# Patient Record
Sex: Male | Born: 1980 | Race: White | Hispanic: No | Marital: Single | Smoking: Current every day smoker
Health system: Southern US, Community
[De-identification: ages and names within clinical notes are randomized; demographics above are authoritative.]

## PROBLEM LIST (undated history)

## (undated) VITALS — BP 111/76 | HR 88 | Temp 97.4°F | Resp 16 | Ht 68.0 in | Wt 176.0 lb

## (undated) VITALS — BP 113/76 | HR 93 | Temp 97.3°F | Resp 20 | Ht 67.5 in | Wt 188.0 lb

## (undated) DIAGNOSIS — F419 Anxiety disorder, unspecified: Secondary | ICD-10-CM

## (undated) DIAGNOSIS — F329 Major depressive disorder, single episode, unspecified: Secondary | ICD-10-CM

## (undated) DIAGNOSIS — F99 Mental disorder, not otherwise specified: Secondary | ICD-10-CM

## (undated) DIAGNOSIS — F32A Depression, unspecified: Secondary | ICD-10-CM

---
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
## 2001-03-31 ENCOUNTER — Encounter: Payer: Self-pay | Admitting: Emergency Medicine

## 2001-03-31 ENCOUNTER — Emergency Department (HOSPITAL_COMMUNITY): Admission: EM | Admit: 2001-03-31 | Discharge: 2001-03-31 | Payer: Self-pay | Admitting: Emergency Medicine

## 2003-02-18 ENCOUNTER — Encounter: Payer: Self-pay | Admitting: Emergency Medicine

## 2003-02-18 ENCOUNTER — Emergency Department (HOSPITAL_COMMUNITY): Admission: EM | Admit: 2003-02-18 | Discharge: 2003-02-18 | Payer: Self-pay | Admitting: Emergency Medicine

## 2004-06-05 ENCOUNTER — Emergency Department (HOSPITAL_COMMUNITY): Admission: EM | Admit: 2004-06-05 | Discharge: 2004-06-06 | Payer: Self-pay | Admitting: Emergency Medicine

## 2005-05-25 ENCOUNTER — Emergency Department (HOSPITAL_COMMUNITY): Admission: EM | Admit: 2005-05-25 | Discharge: 2005-05-25 | Payer: Self-pay | Admitting: Emergency Medicine

## 2005-10-13 ENCOUNTER — Emergency Department (HOSPITAL_COMMUNITY): Admission: EM | Admit: 2005-10-13 | Discharge: 2005-10-13 | Payer: Self-pay | Admitting: Emergency Medicine

## 2006-05-11 ENCOUNTER — Emergency Department (HOSPITAL_COMMUNITY): Admission: EM | Admit: 2006-05-11 | Discharge: 2006-05-12 | Payer: Self-pay | Admitting: Emergency Medicine

## 2009-01-21 ENCOUNTER — Emergency Department (HOSPITAL_COMMUNITY): Admission: EM | Admit: 2009-01-21 | Discharge: 2009-01-21 | Payer: Self-pay | Admitting: Emergency Medicine

## 2010-04-25 ENCOUNTER — Emergency Department (HOSPITAL_COMMUNITY): Admission: EM | Admit: 2010-04-25 | Discharge: 2010-04-25 | Payer: Self-pay | Admitting: Emergency Medicine

## 2010-12-25 ENCOUNTER — Inpatient Hospital Stay (HOSPITAL_COMMUNITY)
Admission: AD | Admit: 2010-12-25 | Discharge: 2010-12-26 | DRG: 882 | Disposition: A | Discharge status: Home / Self Care | Payer: PRIVATE HEALTH INSURANCE | Attending: Psychiatry | Admitting: Psychiatry

## 2010-12-25 ENCOUNTER — Emergency Department (HOSPITAL_COMMUNITY)
Admission: EM | Admit: 2010-12-25 | Discharge: 2010-12-25 | Disposition: A | Discharge status: Other Acute Inpatient Hospital | Payer: Self-pay | Attending: Emergency Medicine | Admitting: Emergency Medicine

## 2010-12-25 DIAGNOSIS — F1010 Alcohol abuse, uncomplicated: Secondary | ICD-10-CM

## 2010-12-25 DIAGNOSIS — IMO0002 Reserved for concepts with insufficient information to code with codable children: Secondary | ICD-10-CM | POA: Insufficient documentation

## 2010-12-25 DIAGNOSIS — R45850 Homicidal ideations: Secondary | ICD-10-CM | POA: Insufficient documentation

## 2010-12-25 DIAGNOSIS — M545 Low back pain, unspecified: Secondary | ICD-10-CM

## 2010-12-25 DIAGNOSIS — W2209XA Striking against other stationary object, initial encounter: Secondary | ICD-10-CM | POA: Insufficient documentation

## 2010-12-25 DIAGNOSIS — F4320 Adjustment disorder, unspecified: Principal | ICD-10-CM

## 2010-12-25 DIAGNOSIS — M79609 Pain in unspecified limb: Secondary | ICD-10-CM | POA: Insufficient documentation

## 2010-12-25 DIAGNOSIS — M7989 Other specified soft tissue disorders: Secondary | ICD-10-CM | POA: Insufficient documentation

## 2010-12-25 LAB — BASIC METABOLIC PANEL
CO2: 25 mEq/L (ref 19–32)
Calcium: 8.8 mg/dL (ref 8.4–10.5)
Chloride: 106 mEq/L (ref 96–112)
Creatinine, Ser: 1 mg/dL (ref 0.4–1.5)
Glucose, Bld: 94 mg/dL (ref 70–99)

## 2010-12-25 LAB — CBC
HCT: 45.8 % (ref 39.0–52.0)
MCH: 32.3 pg (ref 26.0–34.0)
MCHC: 35.4 g/dL (ref 30.0–36.0)
RDW: 12.8 % (ref 11.5–15.5)

## 2010-12-25 LAB — DIFFERENTIAL
Basophils Absolute: 0.1 10*3/uL (ref 0.0–0.1)
Basophils Relative: 1 % (ref 0–1)
Eosinophils Relative: 3 % (ref 0–5)
Monocytes Absolute: 0.8 10*3/uL (ref 0.1–1.0)
Monocytes Relative: 9 % (ref 3–12)

## 2010-12-25 LAB — RAPID URINE DRUG SCREEN, HOSP PERFORMED
Amphetamines: NOT DETECTED
Barbiturates: NOT DETECTED
Benzodiazepines: POSITIVE — AB

## 2010-12-25 LAB — ETHANOL
Alcohol, Ethyl (B): 273 mg/dL — ABNORMAL HIGH (ref 0–10)
Alcohol, Ethyl (B): 158 mg/dL — ABNORMAL HIGH (ref 0–10)
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
## 2010-12-29 NOTE — H&P (Addendum)
Alfred Hicks NO.:  [id number]  MEDICAL RECORD NO.:  [id number]           PATIENT TYPE:  I  LOCATION:  0305                          FACILITY:  [facility]  PHYSICIAN:  Anselm Jungling, MD  DATE OF BIRTH:  1981/01/16  DATE OF ADMISSION:  12/25/2010 DATE OF DISCHARGE:                      PSYCHIATRIC ADMISSION ASSESSMENT   The patient is a 30 year old single white male [facility] by company of the police.  Patient was brought in.  The patient had called the [facility] 9-1-1 stating that he needed to talk to a mental health professional and if he did not talk to someone or was left at home, he would be upset and cause harm to himself or someone else.  At the time of the phone call, the patient was very intoxicated.  PAST PSYCHIATRIC HISTORY:  The source is the patient.  He has none.  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  He has a GED and has had some college courses.  FAMILY HISTORY:  Noncontributory.  The patient denies any history of alcohol and drug abuse and does not feel that he has a problem, although he was incredibly inebriated when brought to the emergency room.  MEDICAL PROBLEMS:  Primary care:  The patient states he has no primary care provider and denies any chronic medical problems.  He has no known drug allergies.  PHYSICAL EXAMINATION:  He was found to have a swollen right hand secondary to, he reports, punching a sign post, but he declines x-ray or any further treatment saying that he does not want to pay for it.  On further evaluation in the emergency room, the patient was normal with the exception of a swollen right hand, again patient refusing x-ray or splint.  He was alert and oriented with normal speech and was somewhat agitated, Alfred Hicks __________, no other problems with the exam  were noted. However, pertinent labs noted an initial alcohol level of 273.  The patient stated he drank a fifth of vodka that morning.  CBC was normal. BMP was also normal.  Alcohol later down to 158.  Drug screen was positive for opiates.  The patient said that he took a Vicodin the day before for back pain from a history of a previous back injury.  He also tested positive for benzodiazepines but was also given benzodiazepines in the emergency room due to his excessive level of alcohol.  He was treated with Librium protocol.  MENTAL STATUS EXAM TODAY:  The patient is alert and oriented x3.  Denies any history of seizures with alcohol withdrawal.  Denies any abuse or overuse of alcohol.  He is alert and oriented x3.  He is calm, cooperative and makes good eye contact.  His speech is clear, goal- directed and coherent.  Mood is calm.  Affect is congruent.  Thought process is linear with no evidence of auditory or visual hallucinations or any disruption in the thought process.  He is in full touch with reality.  Cognitive skills:  He is of at least average intelligence.  ASSESSMENT:  Axis I:  Severe alcohol intoxication with no evidence of psychiatric disorder as notified. Axis II:  __________. Axis III:  Possible fracture of right hand.  The patient is unwilling to pursue further care at this time. Axis IV:  Negative. Axis V:  Current GAF 77.  There is no evidence of the reported psychiatric disability and certainly this is confirmed after a conversation with his significant other, Alfred Hicks __________ , with whom he lives.  She is contacted and says that the patient is indeed a stable individual with no history of extreme violence and is certainly not a risk to himself or anyone else but certainly that there is some concern for his mother, which is the biggest stressor that he has.  After full evaluation and consultation, the patient will be released with follow-up if he would like to  Mental Health to discuss any particular problems with his mom or to explore any further, if he chooses, problems that he may feel he has with substance abuse.    ______________________________ Verne Spurr, PA   ______________________________ Anselm Jungling, MD    NM/MEDQ  D:  12/26/2010  T:  12/26/2010  Job:  [id number]  Electronically Signed by Geralyn Flash MD on 12/29/2010 11:04:53 AM Electronically Signed by Verne Spurr  on 02/03/2011 09:49:03 AM

## 2011-01-01 ENCOUNTER — Emergency Department (HOSPITAL_COMMUNITY): Payer: Self-pay

## 2011-01-01 ENCOUNTER — Emergency Department (HOSPITAL_COMMUNITY)
Admission: EM | Admit: 2011-01-01 | Discharge: 2011-01-01 | Disposition: A | Discharge status: Home / Self Care | Payer: Self-pay | Attending: Emergency Medicine | Admitting: Emergency Medicine

## 2011-01-01 DIAGNOSIS — X58XXXA Exposure to other specified factors, initial encounter: Secondary | ICD-10-CM | POA: Insufficient documentation

## 2011-01-01 DIAGNOSIS — M79609 Pain in unspecified limb: Secondary | ICD-10-CM | POA: Insufficient documentation

## 2011-01-01 DIAGNOSIS — Y929 Unspecified place or not applicable: Secondary | ICD-10-CM | POA: Insufficient documentation

## 2011-01-01 DIAGNOSIS — M7989 Other specified soft tissue disorders: Secondary | ICD-10-CM | POA: Insufficient documentation

## 2011-01-01 DIAGNOSIS — F1010 Alcohol abuse, uncomplicated: Secondary | ICD-10-CM | POA: Insufficient documentation

## 2011-01-01 DIAGNOSIS — S62339A Displaced fracture of neck of unspecified metacarpal bone, initial encounter for closed fracture: Secondary | ICD-10-CM | POA: Insufficient documentation

## 2011-07-04 ENCOUNTER — Inpatient Hospital Stay (HOSPITAL_COMMUNITY)
Admission: EM | Admit: 2011-07-04 | Discharge: 2011-07-05 | DRG: 918 | Disposition: A | Discharge status: Home / Self Care | Payer: Self-pay | Attending: Internal Medicine | Admitting: Internal Medicine

## 2011-07-04 DIAGNOSIS — R0902 Hypoxemia: Secondary | ICD-10-CM | POA: Diagnosis present

## 2011-07-04 DIAGNOSIS — T398X1A Poisoning by other nonopioid analgesics and antipyretics, not elsewhere classified, accidental (unintentional), initial encounter: Principal | ICD-10-CM | POA: Diagnosis present

## 2011-07-04 DIAGNOSIS — M545 Low back pain, unspecified: Secondary | ICD-10-CM | POA: Diagnosis present

## 2011-07-04 DIAGNOSIS — F17200 Nicotine dependence, unspecified, uncomplicated: Secondary | ICD-10-CM | POA: Diagnosis present

## 2011-07-04 DIAGNOSIS — G8929 Other chronic pain: Secondary | ICD-10-CM | POA: Diagnosis present

## 2011-07-04 DIAGNOSIS — F1910 Other psychoactive substance abuse, uncomplicated: Secondary | ICD-10-CM | POA: Diagnosis present

## 2011-07-05 DIAGNOSIS — F4310 Post-traumatic stress disorder, unspecified: Secondary | ICD-10-CM

## 2011-07-05 LAB — RAPID URINE DRUG SCREEN, HOSP PERFORMED
Amphetamines: NOT DETECTED
Benzodiazepines: NOT DETECTED
Cocaine: NOT DETECTED
Opiates: NOT DETECTED

## 2011-07-05 LAB — CBC
HCT: 42.4 % (ref 39.0–52.0)
HCT: 40.8 % (ref 39.0–52.0)
Hemoglobin: 14.8 g/dL (ref 13.0–17.0)
Hemoglobin: 14.4 g/dL (ref 13.0–17.0)
MCHC: 34.9 g/dL (ref 30.0–36.0)
MCHC: 35.3 g/dL (ref 30.0–36.0)
MCV: 90.9 fL (ref 78.0–100.0)
RBC: 4.61 MIL/uL (ref 4.22–5.81)
RDW: 12.5 % (ref 11.5–15.5)

## 2011-07-05 LAB — DIFFERENTIAL
Basophils Absolute: 0 10*3/uL (ref 0.0–0.1)
Basophils Relative: 0 % (ref 0–1)
Lymphocytes Relative: 11 % — ABNORMAL LOW (ref 12–46)
Monocytes Absolute: 0.6 10*3/uL (ref 0.1–1.0)
Neutro Abs: 9.2 10*3/uL — ABNORMAL HIGH (ref 1.7–7.7)
Neutrophils Relative %: 83 % — ABNORMAL HIGH (ref 43–77)

## 2011-07-05 LAB — BASIC METABOLIC PANEL
BUN: 10 mg/dL (ref 6–23)
Creatinine, Ser: 0.82 mg/dL (ref 0.50–1.35)
GFR calc non Af Amer: >60 mL/min (ref >60)
Glucose, Bld: 94 mg/dL (ref 70–99)
Potassium: 3.7 mEq/L (ref 3.5–5.1)

## 2011-07-05 LAB — POCT I-STAT, CHEM 8
BUN: 9 mg/dL (ref 6–23)
Chloride: 105 mEq/L (ref 96–112)
Creatinine, Ser: 1.1 mg/dL (ref 0.50–1.35)
Glucose, Bld: 139 mg/dL — ABNORMAL HIGH (ref 70–99)
Potassium: 3.5 mEq/L (ref 3.5–5.1)
Sodium: 142 mEq/L (ref 135–145)

## 2011-07-05 LAB — SALICYLATE LEVEL: Salicylate Lvl: <2 mg/dL — ABNORMAL LOW (ref 2.8–20.0)

## 2011-07-05 LAB — MRSA PCR SCREENING: MRSA by PCR: NEGATIVE

## 2011-07-05 LAB — ETHANOL: Alcohol, Ethyl (B): 125 mg/dL — ABNORMAL HIGH (ref 0–11)

## 2011-07-06 NOTE — Consult Note (Signed)
  NAMEBRENSON, Alfred Hicks NO.:  [id number]  MEDICAL RECORD NO.:  [id number]  LOCATION:  1237                         FACILITY:  [facility]  PHYSICIAN:  Eulogio Ditch, MD DATE OF BIRTH:  November 07, 1980  DATE OF CONSULTATION:  07/05/2011 DATE OF DISCHARGE:                                CONSULTATION   FACILITY:  [facility]  HISTORY OF PRESENT ILLNESS:  30 year old Caucasian male, who is admitted on the medical floor because of unintentional narcotic overdose.  The patient stated that he took a fentanyl patch because he was working very hard and started having back pain and after putting a fentanyl patch, he took half a tablet of Valium and 2 beers and then he does not know what happened.  The patient was put on Narcan drip in the hospital.  His alcohol level at the time of admission was 125 and urine drug screen positive for marijuana.  PAST MEDICAL HISTORY:  No active medical issue.  ALLERGIES:  No known drug allergies. SOCIAL HISTORY:  The patient lives with the grandparents, has a girlfriend.  The patient works in a [employer].  MENTAL STATUS EXAM:  The patient is calm, cooperative to the interview. Fair eye contact.  No abnormal movements noticed.  Pleasant on approach, logical and goal directed.  Not suicidal or homicidal, not hallucinating or delusional.  Cognition:  Alert, awake, oriented x3.  Memory: Immediate, recent, remote fair.  Attention and concentration fair. Abstraction ability fair.  Insight and judgment intact.  PAST PSYCH HISTORY:  The patient has a brief admission at [facility] on December 26, 2010, for alcohol abuse.  Currently, the patient is not on any psych medication and is not following any psychiatrist in the outpatient setting.  DIAGNOSES:  AXIS I:  Polysubstance dependence. AXIS II:  Deferred. AXIS III:  See medical notes. AXIS IV:  Chronic substance abuse. AXIS V:  55  RECOMMENDATIONS: 1. At this time, the patient  does not want to get any treatment for     drug abuse, both inpatient setting and outpatient setting. 2. I encouraged the patient to get a counseling and treatment for his     substance abuse, but the patient is not     motivated to follow up in the outpatient setting at this time. 3. Psychoeducation given regarding the substance abuse. 4. The patient can be discharged after he is medically cleared.     Eulogio Ditch, MD     SA/MEDQ  D:  07/05/2011  T:  07/05/2011  Job:  [id number]  Electronically Signed by Eulogio Ditch  on 07/06/2011 01:06:55 PM

## 2011-07-12 NOTE — H&P (Signed)
NAMECLAIR, Alfred Hicks NO.:  [id number]  MEDICAL RECORD NO.:  [id number]  LOCATION:  1237                         FACILITY:  [facility]  PHYSICIAN:  Della Goo, M.D. DATE OF BIRTH:  1981/06/16  DATE OF ADMISSION:  07/04/2011 DATE OF DISCHARGE:                             HISTORY & PHYSICAL   PRIMARY CARE PHYSICIAN:  None.  CHIEF COMPLAINT:  Overdose.  HISTORY OF PRESENT ILLNESS:  This is a 30 year old male, who was brought to the emergency department after his girlfriend called EMS secondary to decreased consciousness.  The patient was reported as drinking alcohol and also had placed a fentanyl patch on his back.  Sometime afterward, the patient had been found breathing only 2-3 times a minute and EMS was called.  The patient was given Narcan x1 dose and improved.  He was brought to the emergency department and he was in and out of consciousness with decreased O2 saturations down to 86%.  The patienthad to be placed on a Narcan drip.  When the patient was awake, he was interviewed and he states that he thought he was taking one of his over- the-counter patches which he got at the dollar store and he said he placed the patch on his neck.  The patient also states that he had taken one-half of a Valium tablet as well and had 2 beers to drink.  The patient also states that he was not prescribed the fentanyl or Valium.  In the emergency department, the patient was found to have an alcohol level of 125.  His drug screen was positive for cannabis.  PAST MEDICAL HISTORY:  Alcohol abuse.  PAST SURGICAL HISTORY:  None.  MEDICATIONS:  None.  ALLERGIES:  No known drug allergies.  SOCIAL HISTORY:  The patient smokes.  He smokes one pack of cigarettes daily for 10 years.  He also reports that he drinks beers off and on. He drank 2 beers this evening and prior to this, he states the day before, he had one beer.  He denies any illicit drug usage.  FAMILY HISTORY:   Noncontributory.  REVIEW OF SYSTEMS:  Pertinents are mentioned above in the HPI.  All other review of systems are negative.  PHYSICAL EXAMINATION:  GENERAL:  This is a 30 year old well-nourished, well-developed Caucasian male, who is obtunded, and currently in no acute distress. VITAL SIGNS:  Temperature 97.6, blood pressure 126/74, heart rate 94, respirations 12, O2 saturations initially 86% prior to the Narcan and supplemental oxygen.  He is now 95%. HEENT:  Normocephalic, atraumatic.  Pupils sluggish, pinpoint, and symmetric.  Extraocular movements are intact.  Funduscopic, unable to visualize.  Nares are patent bilaterally.  Oropharynx is clear. NECK:  Supple full range of motion.  No thyromegaly, adenopathy, or jugular venous distention. CARDIOVASCULAR:  Regular rate and rhythm.  No murmurs, gallops, or rubs appreciated. LUNGS: Clear to auscultation bilaterally.  No rales, rhonchi, or wheezes.  Chest wall is nontender.  Chest wall excursion is symmetric and breathing is unlabored at this time. ABDOMEN:  Positive bowel sounds.  Soft, nontender, nondistended.  No hepatosplenomegaly. EXTREMITIES:  Without cyanosis, clubbing or edema. NEUROLOGIC:  The patient is obtunded, but arousable.  He is able to move all four of his extremities.  There are no motor or sensory deficits.  LABORATORY STUDIES:  White blood cell count 11.1, hemoglobin 40.8, hematocrit 42.4, MCV 92.0, platelets 174, neutrophils 84% lymphocytes 11%.  Sodium 142, potassium 3.5, chloride 105, CO2 of 22, BUN 9, creatinine 1.10, glucose 139.  Urine drug screen as mentioned above, positive for cannabis.  Alcohol level 125.  Salicylate level less than 2.0.  Acetaminophen level less than 15.0.  ASSESSMENT:  A 30 year old male being admitted with, 1. Unintentional narcotic overdose. 2. Hypoxemia. 3. Acute respiratory failure. 4. Alcohol intoxication. 5. Tobacco use disorder.  PLAN:  The patient will be admitted to  the step-down ICU area.  He will continue on the IV Narcan drip.  He will remain on supplemental oxygen and IV fluids have been ordered.  He is n.p.o. for now secondary to his obtundation.  Diet will be advanced once he is more alert.  Antiemetic therapies have been ordered as needed and the patient will be placed on DVT prophylaxis.  Substance abuse counseling will also be ordered for this patient.     Della Goo, M.D.     HJ/MEDQ  D:  07/05/2011  T:  07/05/2011  Job:  [id number]  Electronically Signed by Della Goo M.D. on 07/12/2011 09:44:41 PM
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
## 2011-11-17 ENCOUNTER — Encounter (HOSPITAL_COMMUNITY): Payer: Self-pay | Admitting: Emergency Medicine

## 2011-11-17 ENCOUNTER — Emergency Department (HOSPITAL_COMMUNITY)
Admission: EM | Admit: 2011-11-17 | Discharge: 2011-11-17 | Discharge status: Against Medical Advice | Payer: Self-pay | Attending: Emergency Medicine | Admitting: Emergency Medicine

## 2011-11-17 ENCOUNTER — Emergency Department (HOSPITAL_COMMUNITY): Payer: Self-pay

## 2011-11-17 DIAGNOSIS — M545 Low back pain, unspecified: Secondary | ICD-10-CM | POA: Insufficient documentation

## 2011-11-17 DIAGNOSIS — M549 Dorsalgia, unspecified: Secondary | ICD-10-CM

## 2011-11-17 MED ORDER — KETOROLAC TROMETHAMINE 60 MG/2ML IM SOLN
60.0000 mg | Freq: Once | INTRAMUSCULAR | Status: AC
  Administered 2011-11-17: 60 mg via INTRAMUSCULAR
  Filled 2011-11-17: qty 2

## 2011-11-17 MED ORDER — CYCLOBENZAPRINE HCL 10 MG PO TABS
10.0000 mg | ORAL_TABLET | Freq: Once | ORAL | Status: AC
  Administered 2011-11-17: 10 mg via ORAL
  Filled 2011-11-17: qty 1

## 2011-11-17 NOTE — ED Notes (Signed)
Xray came to transport patient to xray and the patient is not on the stretcher and his girlfriend is gone. Patient is not in the restroom. EDP advised of patient status.
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
## 2011-11-18 NOTE — ED Provider Notes (Signed)
I saw and evaluated the patient, reviewed the resident's note and I agree with the findings and plan.  Pt left the ED AMA prior to my evaluation  Lyanne Co, MD 11/18/11 [phone]

## 2011-11-19 ENCOUNTER — Emergency Department: Payer: Self-pay | Admitting: *Deleted

## 2012-08-29 ENCOUNTER — Emergency Department (HOSPITAL_COMMUNITY)
Admission: EM | Admit: 2012-08-29 | Discharge: 2012-08-30 | Disposition: A | Discharge status: Other Acute Inpatient Hospital | Payer: Self-pay | Attending: Emergency Medicine | Admitting: Emergency Medicine

## 2012-08-29 ENCOUNTER — Encounter (HOSPITAL_COMMUNITY): Payer: Self-pay | Admitting: *Deleted

## 2012-08-29 ENCOUNTER — Emergency Department (HOSPITAL_COMMUNITY): Payer: Self-pay

## 2012-08-29 ENCOUNTER — Ambulatory Visit (HOSPITAL_COMMUNITY)
Admission: RE | Admit: 2012-08-29 | Discharge: 2012-08-29 | Disposition: A | Discharge status: Home / Self Care | Payer: Self-pay | Attending: Psychiatry | Admitting: Psychiatry

## 2012-08-29 DIAGNOSIS — F17200 Nicotine dependence, unspecified, uncomplicated: Secondary | ICD-10-CM | POA: Insufficient documentation

## 2012-08-29 DIAGNOSIS — F39 Unspecified mood [affective] disorder: Secondary | ICD-10-CM | POA: Insufficient documentation

## 2012-08-29 DIAGNOSIS — Z8659 Personal history of other mental and behavioral disorders: Secondary | ICD-10-CM | POA: Insufficient documentation

## 2012-08-29 DIAGNOSIS — Z008 Encounter for other general examination: Secondary | ICD-10-CM | POA: Insufficient documentation

## 2012-08-29 HISTORY — DX: Anxiety disorder, unspecified: F41.9

## 2012-08-29 HISTORY — DX: Depression, unspecified: F32.A

## 2012-08-29 HISTORY — DX: Major depressive disorder, single episode, unspecified: F32.9

## 2012-08-29 HISTORY — DX: Mental disorder, not otherwise specified: F99

## 2012-08-29 LAB — CBC
HCT: 45 % (ref 39.0–52.0)
Hemoglobin: 16.1 g/dL (ref 13.0–17.0)
MCH: 33.3 pg (ref 26.0–34.0)
RBC: 4.83 MIL/uL (ref 4.22–5.81)

## 2012-08-29 LAB — COMPREHENSIVE METABOLIC PANEL
ALT: 44 U/L (ref 0–53)
Alkaline Phosphatase: 61 U/L (ref 39–117)
BUN: 7 mg/dL (ref 6–23)
CO2: 27 mEq/L (ref 19–32)
GFR calc Af Amer: >90 mL/min (ref >90)
GFR calc non Af Amer: >90 mL/min (ref >90)
Glucose, Bld: 132 mg/dL — ABNORMAL HIGH (ref 70–99)
Potassium: 3.4 mEq/L — ABNORMAL LOW (ref 3.5–5.1)
Sodium: 138 mEq/L (ref 135–145)

## 2012-08-29 LAB — RAPID URINE DRUG SCREEN, HOSP PERFORMED
Barbiturates: NOT DETECTED
Tetrahydrocannabinol: NOT DETECTED

## 2012-08-29 LAB — ACETAMINOPHEN LEVEL: Acetaminophen (Tylenol), Serum: <15 ug/mL (ref 10–30)

## 2012-08-29 MED ORDER — NICOTINE 14 MG/24HR TD PT24: 14.0000 mg | MEDICATED_PATCH | Freq: Once | TRANSDERMAL | Status: DC

## 2012-08-29 MED ORDER — LORAZEPAM 1 MG PO TABS
1.0000 mg | ORAL_TABLET | Freq: Once | ORAL | Status: AC
  Administered 2012-08-29: 1 mg via ORAL
  Filled 2012-08-29: qty 1

## 2012-08-29 MED ORDER — NICOTINE 21 MG/24HR TD PT24
MEDICATED_PATCH | TRANSDERMAL | Status: AC
  Administered 2012-08-29: 21 mg via TRANSDERMAL
  Filled 2012-08-29: qty 1

## 2012-08-29 MED ORDER — NICOTINE 21 MG/24HR TD PT24
21.0000 mg | MEDICATED_PATCH | Freq: Once | TRANSDERMAL | Status: DC
  Administered 2012-08-29: 21 mg via TRANSDERMAL
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
## 2012-08-29 NOTE — ED Notes (Signed)
Spoke w/ [facility] and pt needs medical clearance including head CT, then pt may return to [facility].

## 2012-08-29 NOTE — ED Notes (Signed)
Spoke w/ the [facility] at [facility] - pt is accepted there however will need to hold pt for <1hr d/t x2 admissions recently arriving to their facility.

## 2012-08-30 ENCOUNTER — Inpatient Hospital Stay (HOSPITAL_COMMUNITY)
Admission: EM | Admit: 2012-08-30 | Discharge: 2012-09-05 | DRG: 885 | Disposition: A | Discharge status: Home / Self Care | Payer: No Typology Code available for payment source | Source: Ambulatory Visit | Attending: Psychiatry | Admitting: Psychiatry

## 2012-08-30 ENCOUNTER — Encounter (HOSPITAL_COMMUNITY): Payer: Self-pay | Admitting: *Deleted

## 2012-08-30 DIAGNOSIS — Z79899 Other long term (current) drug therapy: Secondary | ICD-10-CM

## 2012-08-30 DIAGNOSIS — F1020 Alcohol dependence, uncomplicated: Secondary | ICD-10-CM | POA: Diagnosis present

## 2012-08-30 DIAGNOSIS — F10988 Alcohol use, unspecified with other alcohol-induced disorder: Secondary | ICD-10-CM | POA: Diagnosis present

## 2012-08-30 DIAGNOSIS — F411 Generalized anxiety disorder: Secondary | ICD-10-CM | POA: Diagnosis present

## 2012-08-30 DIAGNOSIS — F1024 Alcohol dependence with alcohol-induced mood disorder: Secondary | ICD-10-CM | POA: Diagnosis present

## 2012-08-30 DIAGNOSIS — R45851 Suicidal ideations: Secondary | ICD-10-CM

## 2012-08-30 DIAGNOSIS — F323 Major depressive disorder, single episode, severe with psychotic features: Principal | ICD-10-CM | POA: Diagnosis present

## 2012-08-30 MED ORDER — CITALOPRAM HYDROBROMIDE 20 MG PO TABS
20.0000 mg | ORAL_TABLET | Freq: Every day | ORAL | Status: DC
  Administered 2012-08-30 – 2012-09-05 (×7): 20 mg via ORAL
  Filled 2012-08-30 (×8): qty 1

## 2012-08-30 MED ORDER — BENZTROPINE MESYLATE 0.5 MG PO TABS
0.5000 mg | ORAL_TABLET | Freq: Two times a day (BID) | ORAL | Status: DC
  Administered 2012-08-30 – 2012-09-01 (×4): 0.5 mg via ORAL
  Filled 2012-08-30 (×6): qty 1

## 2012-08-30 MED ORDER — NICOTINE 21 MG/24HR TD PT24
21.0000 mg | MEDICATED_PATCH | Freq: Every day | TRANSDERMAL | Status: DC
  Administered 2012-08-30 – 2012-09-01 (×3): 21 mg via TRANSDERMAL
  Filled 2012-08-30 (×7): qty 1

## 2012-08-30 MED ORDER — LORAZEPAM 1 MG PO TABS
1.0000 mg | ORAL_TABLET | Freq: Once | ORAL | Status: AC
  Administered 2012-08-30: 1 mg via ORAL
  Filled 2012-08-30: qty 1

## 2012-08-30 MED ORDER — TRAZODONE HCL 50 MG PO TABS
50.0000 mg | ORAL_TABLET | Freq: Every evening | ORAL | Status: DC | PRN
  Filled 2012-08-30: qty 1

## 2012-08-30 MED ORDER — CHLORDIAZEPOXIDE HCL 25 MG PO CAPS
25.0000 mg | ORAL_CAPSULE | Freq: Four times a day (QID) | ORAL | Status: DC | PRN
  Administered 2012-08-30 – 2012-09-02 (×9): 25 mg via ORAL
  Filled 2012-08-30 (×9): qty 1

## 2012-08-30 MED ORDER — ALUM & MAG HYDROXIDE-SIMETH 200-200-20 MG/5ML PO SUSP: 30.0000 mL | ORAL | Status: DC | PRN

## 2012-08-30 MED ORDER — HALOPERIDOL 2 MG PO TABS
2.0000 mg | ORAL_TABLET | Freq: Two times a day (BID) | ORAL | Status: DC
  Administered 2012-08-30 – 2012-09-01 (×4): 2 mg via ORAL
  Filled 2012-08-30 (×6): qty 1

## 2012-08-30 MED ORDER — GABAPENTIN 400 MG PO CAPS
400.0000 mg | ORAL_CAPSULE | Freq: Two times a day (BID) | ORAL | Status: DC
  Administered 2012-08-30 – 2012-08-31 (×2): 400 mg via ORAL
  Filled 2012-08-30 (×4): qty 1

## 2012-08-30 MED ORDER — MAGNESIUM HYDROXIDE 400 MG/5ML PO SUSP: 30.0000 mL | Freq: Every day | ORAL | Status: DC | PRN

## 2012-08-30 MED ORDER — ACETAMINOPHEN 325 MG PO TABS
650.0000 mg | ORAL_TABLET | Freq: Four times a day (QID) | ORAL | Status: DC | PRN
  Administered 2012-08-30 – 2012-09-03 (×3): 650 mg via ORAL

## 2012-08-30 MED ORDER — TRAZODONE HCL 50 MG PO TABS
50.0000 mg | ORAL_TABLET | Freq: Every evening | ORAL | Status: DC | PRN
  Administered 2012-08-30 (×3): 50 mg via ORAL
  Filled 2012-08-30 (×4): qty 1
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
## 2012-08-30 NOTE — Progress Notes (Signed)
Psychoeducational Group Note  Date:  08/30/2012 Time:  0930  Group Topic/Focus:  Recovery Goals:   The focus of this group is to identify appropriate goals for recovery and establish a plan to achieve them.  Participation Level:  Did Not Attend  Participation Quality:    Affect:    Cognitive:    Insight:    Engagement in Group:    Additional Comments:  none  Marshun Duva M 08/30/2012, 11:00 AM
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
## 2012-08-30 NOTE — Tx Team (Signed)
Initial Interdisciplinary Treatment Plan  PATIENT STRENGTHS: (choose at least two) Ability for insight Average or above average intelligence Capable of independent living Communication skills General fund of knowledge Motivation for treatment/growth Physical Health Supportive family/friends  PATIENT STRESSORS: Financial difficulties Legal issue Occupational concerns Traumatic event Substance abuse   PROBLEM LIST: Problem List/Patient Goals Date to be addressed Date deferred Reason deferred Estimated date of resolution  Depression      Anxiety      SI      Thought disorder      Substance abuse                               DISCHARGE CRITERIA:  Ability to meet basic life and health needs Adequate post-discharge living arrangements Improved stabilization in mood, thinking, and/or behavior Motivation to continue treatment in a less acute level of care Need for constant or close observation no longer present Reduction of life-threatening or endangering symptoms to within safe limits Safe-care adequate arrangements made Verbal commitment to aftercare and medication compliance  PRELIMINARY DISCHARGE PLAN: Outpatient therapy Return to previous living arrangement  PATIENT/FAMIILY INVOLVEMENT: This treatment plan has been presented to and reviewed with the patient, Alfred Hicks.  The patient has been given the opportunity to ask questions and make suggestions.  Arturo Morton 08/30/2012, 1:46 AM
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
## 2012-08-30 NOTE — ED Notes (Signed)
Pt increasingly agitated, requesting to go smoke, discussed hospital policy w/ pt - mother at bedside requesting pt have something to help w/ agitation - Remi Haggard, PA made aware orders given to repeat PO ativan.

## 2012-08-30 NOTE — ED Provider Notes (Signed)
Medical screening examination/treatment/procedure(s) were performed by non-physician practitioner and as supervising physician I was immediately available for consultation/collaboration.   Gwyneth Sprout, MD 08/30/12 1539

## 2012-08-30 NOTE — Treatment Plan (Signed)
Interdisciplinary Treatment Plan Update (Adult)  Date: 08/30/2012  Time Reviewed: 3:33 PM   Progress in Treatment: Attending groups: Yes Participating in groups: Yes Taking medication as prescribed: Yes Tolerating medication: Yes   Family/Significant other contact made:  No Patient understands diagnosis:  Yes  As evidenced by asking for help with anger and substance abuse Discussing patient identified problems/goals with staff:  Yes  See below Medical problems stabilized or resolved:  Yes Denies suicidal/homicidal ideation: Yes  In tx team Issues/concerns per patient self-inventory:  Not filled out Other:  New problem(s) identified: N/A  Reason for Continuation of Hospitalization: Anxiety Depression Medication stabilization  Interventions implemented related to continuation of hospitalization: Haldol, Neurontin trial  Encourage group attendance and participation  Additional comments:  [name] states he does not want to go to rehab.  He is interested in attending AA mtgs in the [place] area where he lives with his mother  Estimated length of stay: 3-4 days  Discharge Plan:  New goal(s): N/A  Review of initial/current patient goals per problem list:   1.  Goal(s): Eliminate SI  Met:  Yes  Target date:11/5  As evidenced OZ:HYQM report  2.  Goal (s):Stabilize mood  Met:  No  Target date:11/8  As evidenced VH:QIONG will report his depression to be a 4 or less, and that his anger and impulsiveness feel manageable  3.  Goal(s):Identify comprehensive sobriety and mental wellness plan  Met:  No  Target date:11/8  As evidenced EX:BMWU report    4.  Goal(s):  Met:  No  Target date:  As evidenced by:  Attendees: Patient:  Alfred Hicks 08/30/2012 3:33 PM  Family:     Physician:  Thedore Mins 08/30/2012 3:33 PM   Nursing:    08/30/2012 3:33 PM   Clinical Social Worker:  Richelle Ito 08/30/2012 3:33 PM   Extender:   08/30/2012 3:33 PM   Other:     Other:       Other:     Other:      Scribe for Treatment Team:   Ida Rogue, 08/30/2012 3:33 PM

## 2012-08-31 MED ORDER — GABAPENTIN 400 MG PO CAPS
400.0000 mg | ORAL_CAPSULE | Freq: Three times a day (TID) | ORAL | Status: DC
  Administered 2012-08-31 – 2012-09-05 (×16): 400 mg via ORAL
  Filled 2012-08-31 (×18): qty 1

## 2012-08-31 MED ORDER — TRAZODONE HCL 100 MG PO TABS
100.0000 mg | ORAL_TABLET | Freq: Every evening | ORAL | Status: DC | PRN
  Administered 2012-08-31 – 2012-09-01 (×4): 100 mg via ORAL
  Filled 2012-08-31 (×9): qty 1

## 2012-08-31 NOTE — Progress Notes (Signed)
Patient ID: Alfred Hicks, male   DOB: 28-Feb-1981, 31 y.o.   MRN: [id number]  D: Patient sitting in dayroom on approach. Drawing on a piece of paper stating that drawing is the only thing that helps his hands from not shaking and the drawings explain the hallucinations. Reports mood improvement and denies any SI or HI. Did talk about problems with anxiety and having an anxiety attack when he got off the elevator today. Says the medicine he is on is not working for his anxiety. A: Staff will monitor on q 15 minute checks and follow treatments and give meds as ordered. R: Patient cooperative and continued watching tv and drawing at this time.

## 2012-08-31 NOTE — Progress Notes (Signed)
  D) Patient pleasant and cooperative upon my assessment. Patient c/o increasing anxiety and "crippling social anxiety." Patient educated re medications,m scheduled and PRN. Patient states slept " poor," and  appetite is "improving." Patient rates depression as   8/10, patient rates hopeless feelings as 5 /10. Patient denies SI/HI, denies visual hallucinations at this time. Patient endorses audio hallucinations, states"I have been hearing voices for years, it really doesn't bother me." Patient denies command audio hallucinations.    A) Patient offered support and encouragement, patient encouraged to discuss feelings/concerns with staff. Patient verbalized understanding. Patient monitored Q15 minutes for safety. Patient met with MD and treatment team to discuss today's goals and plan of care.  R) Patient active on unit, attending groups in day room and meals in dining room.  Patient insightful, understands "I need to get my anxiety under control so that I can get back to work." Patient has a plan "don't drink, hope meds are fixed." Patient taking medications as ordered. Will continue to monitor.

## 2012-08-31 NOTE — Progress Notes (Signed)
Psychoeducational Group Note  Date:  08/31/2012 Time:  1100   Group Topic/Focus:  Personal Choices and Values:   The focus of this group is to help patients assess and explore the importance of values in their lives, how their values affect their decisions, how they express their values and what opposes their expression.  Participation Level:  Active  Participation Quality:  Appropriate, Attentive and Sharing  Affect:  Appropriate  Cognitive:  Alert and Appropriate  Insight:  Good  Engagement in Group:  Good  Additional Comments:  Pt. Participated in group and identified his values, barriers to reaching his goals, and coping skills.   Ruta Hinds [facility] 08/31/2012, 11:34 AM

## 2012-08-31 NOTE — Progress Notes (Signed)
Patient ID: Alfred Hicks, male   DOB: 1980/11/10, 31 y.o.   MRN: [id number]  D:  Patient appears sad and depressed. 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 Patient is very cooperative with coaching.  Patient  Preoccupied with anxiety and his symptoms and needs constant redirection and focus.Pt denies SI/HI/AVH and agreed to contract for safety. Pt exhibits drug seeking behaviors, by constantly requesting something for anxiety.     A: Safety  maintained with Q15 min  checks. Support and encouragement provided. Pt given scheduled meds. Pt given PRN tylenol for pain in L-hip area   R: Patient remains safe. He is complaint with medication. Safety has been maintained Q15 and continue current POC

## 2012-08-31 NOTE — Progress Notes (Signed)
[facility] MD Progress Note  08/31/2012 9:05 AM Alfred Hicks  MRN:  [id number]  Diagnosis:  Major depressive disorder, single episode severe, with psychosis                     Alcohol dependence with alcohol-induced mood disorder  Subjective: " I am still feeling depressed, agitated, moody and anxious"  ADL's:  Intact  Sleep: Poor  Appetite:  Fair  Suicidal Ideation:  Plan:  denies  Intent:  denies Means:  denies Homicidal Ideation:  Plan:  denies Intent:  denies Means:  denies  Objective: Patient reports difficulty sleeping, feeling extremely anxious, depressed, and internal agitation. He stated that he had panic attacks yesterday while in the group with peers. He reports intermittent auditory hallucination and suicidal thoughts with no plan.  Review of Systems  Constitutional: Negative for fever and chills.  Eyes: Negative for blurred vision.  Respiratory: Negative for cough and shortness of breath.   Cardiovascular: Positive for palpitations.  Gastrointestinal: Negative for abdominal pain and diarrhea.  Genitourinary: Negative.   Musculoskeletal: Negative for myalgias.  Neurological: Negative for tremors and headaches.  Endo/Heme/Allergies: Negative.   Psychiatric/Behavioral: Positive for depression. The patient is nervous/anxious and has insomnia.      Mental Status Examination/Evaluation: Objective:  Appearance: Casual and Fairly Groomed  Eye Contact::  Fair  Speech:  Clear and Coherent and Slow  Volume:  Decreased  Mood:  Anxious, Depressed and Irritable  Affect:  Blunt  Thought Process:  Coherent  Orientation:  Full  Thought Content:  Hallucinations: Auditory  Suicidal Thoughts:  Yes.  without intent/plan  Homicidal Thoughts:  No  Memory:  Immediate;   Good Recent;   Good Remote;   Good  Judgement:  Impaired  Insight:  Lacking  Psychomotor Activity:  Increased and Tremor  Concentration:  Poor  Recall:  Fair  Akathisia:  No  Handed:  Right  AIMS (if  indicated):     Assets:  Communication Skills Desire for Improvement Physical Health  Sleep:  Number of Hours: 6.25    Vital Signs:Blood pressure 104/70, pulse 101, temperature 97.3 F (36.3 C), temperature source Oral, resp. rate 18, height 5' 7.5" (1.715 m), weight 85.276 kg (188 lb). Current Medications: Current Facility-Administered Medications  Medication Dose Route Frequency Provider Last Rate Last Dose  . acetaminophen (TYLENOL) tablet 650 mg  650 mg Oral Q6H PRN Kerry Hough, PA   650 mg at 08/30/12 2300  . alum & mag hydroxide-simeth (MAALOX/MYLANTA) 200-200-20 MG/5ML suspension 30 mL  30 mL Oral Q4H PRN Kerry Hough, PA      . benztropine (COGENTIN) tablet 0.5 mg  0.5 mg Oral BID Seydina Holliman   0.5 mg at 08/31/12 0804  . chlordiazePOXIDE (LIBRIUM) capsule 25 mg  25 mg Oral QID PRN Kerry Hough, PA   25 mg at 08/31/12 0852  . citalopram (CELEXA) tablet 20 mg  20 mg Oral Daily Jameison Haji   20 mg at 08/31/12 0804  . gabapentin (NEURONTIN) capsule 400 mg  400 mg Oral TID Vieno Tarrant      . haloperidol (HALDOL) tablet 2 mg  2 mg Oral BID Katelan Hirt   2 mg at 08/31/12 0804  . magnesium hydroxide (MILK OF MAGNESIA) suspension 30 mL  30 mL Oral Daily PRN Kerry Hough, PA      . nicotine (NICODERM CQ - dosed in mg/24 hours) patch 21 mg  21 mg Transdermal Q0600 Kerry Hough, PA  21 mg at 08/30/12 1303  . traZODone (DESYREL) tablet 100 mg  100 mg Oral QHS,MR X 1 Terrill Alperin      . [DISCONTINUED] gabapentin (NEURONTIN) capsule 400 mg  400 mg Oral BID Bernese Doffing   400 mg at 08/31/12 0804  . [DISCONTINUED] traZODone (DESYREL) tablet 50 mg  50 mg Oral QHS,MR X 1 Kerry Hough, PA   50 mg at 08/30/12 2300    Lab Results:  Results for orders placed during the hospital encounter of 08/29/12 (from the past 48 hour(s))  ACETAMINOPHEN LEVEL     Status: Normal   Collection Time   08/29/12  9:40 PM      Component Value Range Comment   Acetaminophen  (Tylenol), Serum <15.0  10 - 30 ug/mL   CBC     Status: Normal   Collection Time   08/29/12  9:40 PM      Component Value Range Comment   WBC 9.4  4.0 - 10.5 K/uL    RBC 4.83  4.22 - 5.81 MIL/uL    Hemoglobin 16.1  13.0 - 17.0 g/dL    HCT 47.8  29.5 - 62.1 %    MCV 93.2  78.0 - 100.0 fL    MCH 33.3  26.0 - 34.0 pg    MCHC 35.8  30.0 - 36.0 g/dL    RDW 30.8  65.7 - 84.6 %    Platelets 267  150 - 400 K/uL   COMPREHENSIVE METABOLIC PANEL     Status: Abnormal   Collection Time   08/29/12  9:40 PM      Component Value Range Comment   Sodium 138  135 - 145 mEq/L    Potassium 3.4 (*) 3.5 - 5.1 mEq/L    Chloride 101  96 - 112 mEq/L    CO2 27  19 - 32 mEq/L    Glucose, Bld 132 (*) 70 - 99 mg/dL    BUN 7  6 - 23 mg/dL    Creatinine, Ser 9.62  0.50 - 1.35 mg/dL    Calcium 9.2  8.4 - 95.2 mg/dL    Total Protein 7.0  6.0 - 8.3 g/dL    Albumin 3.8  3.5 - 5.2 g/dL    AST 32  0 - 37 U/L    ALT 44  0 - 53 U/L    Alkaline Phosphatase 61  39 - 117 U/L    Total Bilirubin 0.5  0.3 - 1.2 mg/dL    GFR calc non Af Amer >90  >90 mL/min    GFR calc Af Amer >90  >90 mL/min   ETHANOL     Status: Normal   Collection Time   08/29/12  9:40 PM      Component Value Range Comment   Alcohol, Ethyl (B) <11  0 - 11 mg/dL   SALICYLATE LEVEL     Status: Abnormal   Collection Time   08/29/12  9:40 PM      Component Value Range Comment   Salicylate Lvl <2.0 (*) 2.8 - 20.0 mg/dL   URINE RAPID DRUG SCREEN (HOSP PERFORMED)     Status: Abnormal   Collection Time   08/29/12 10:40 PM      Component Value Range Comment   Opiates POSITIVE (*) NONE DETECTED    Cocaine NONE DETECTED  NONE DETECTED    Benzodiazepines POSITIVE (*) NONE DETECTED    Amphetamines NONE DETECTED  NONE DETECTED    Tetrahydrocannabinol NONE DETECTED  NONE DETECTED  Barbiturates NONE DETECTED  NONE DETECTED     Physical Findings: AIMS: Facial and Oral Movements Muscles of Facial Expression: None, normal Lips and Perioral Area: None,  normal Jaw: None, normal Tongue: None, normal,Extremity Movements Upper (arms, wrists, hands, fingers): None, normal Lower (legs, knees, ankles, toes): None, normal, Trunk Movements Neck, shoulders, hips: None, normal, Overall Severity Severity of abnormal movements (highest score from questions above): None, normal Incapacitation due to abnormal movements: None, normal Patient's awareness of abnormal movements (rate only patient's report): No Awareness, Dental Status Current problems with teeth and/or dentures?: No Does patient usually wear dentures?: No  CIWA:  CIWA-Ar Total: 1  COWS:     Treatment Plan Summary: Daily contact with patient to assess and evaluate symptoms and progress in treatment Medication management  Plan:1. Admit for crisis management and stabilization. 2. Medication management to reduce current symptoms to base line and improve the patient's overall level of functioning 3. Treat health problems as indicated. 4. Develop treatment plan to decrease risk of relapse upon discharge and the need for readmission. 5. Psycho-social education regarding relapse prevention and self care. 6. Health care follow up as needed for medical problems. 7. Patient to be allowed to attend AA meeting at [address] [place].    Thedore Mins, MD 08/31/2012, 9:05 AM

## 2012-08-31 NOTE — Discharge Planning (Signed)
Pt attended morning aftercare planning group and tx team. He reported that he is "doing all right" but has been experiencing severe panic attacks and does not feel any effects from medication. Pt also stated that he has social anxiety and has difficulty remaining calm in group setting, but wanted to stay in group this morning. He reported that his social anxiety and tremors have impacted his job performance significantly and stated that several family members are affected by anxiety issues. He also reported that he is still experiencing occassional visual and auditory hallucinations. Patient offered referral to rehab but would rather return to his mother's home in order to begin employment. Pt was told about med changes to address alcohol withdrawal symptoms and severe anxiety during tx team. Pt will be permitted to attend AA meetings on [address]. [name] attended the PM group with Onalee Hua from [facility].  He was attentive, but did not speak.

## 2012-09-01 MED ORDER — CLONAZEPAM 0.5 MG PO TABS
0.5000 mg | ORAL_TABLET | Freq: Two times a day (BID) | ORAL | Status: DC
  Administered 2012-09-01 – 2012-09-05 (×8): 0.5 mg via ORAL
  Filled 2012-09-01 (×8): qty 1

## 2012-09-01 MED ORDER — QUETIAPINE FUMARATE 100 MG PO TABS
100.0000 mg | ORAL_TABLET | Freq: Every day | ORAL | Status: DC
  Administered 2012-09-01: 100 mg via ORAL
  Filled 2012-09-01 (×2): qty 1

## 2012-09-01 NOTE — Progress Notes (Signed)
Patient ID: Alfred Hicks, male   DOB: Apr 04, 1981, 31 y.o.   MRN: [id number] [facility] MD Progress Note  09/01/2012 11:30 AM SOL BREYETTE  MRN:  [id number]  Diagnosis:  Major depressive disorder, single episode severe, with psychosis                     Alcohol dependence with alcohol-induced mood disorder  Subjective: " I am still feeling irritable, psychotic, depressed, agitated and anxious"  ADL's:  Intact  Sleep: fair  Appetite:  Fair  Suicidal Ideation:  Plan:  denies  Intent:  denies Means:  denies Homicidal Ideation:  Plan:  denies Intent:  denies Means:  denies  Objective: Patient reports that he slept better last night, feeling  anxious, depressed, and psychotic. He reports ongoing panic attacks especially when he is with other people, bilateral hand tremors and internal agitation. He reports intermittent auditory hallucination but denies suicidal ideations today.  Review of Systems  Constitutional: Negative for fever and chills.  Eyes: Negative for blurred vision.  Respiratory: Negative for cough and shortness of breath.   Cardiovascular: Positive for palpitations.  Gastrointestinal: Negative for abdominal pain and diarrhea.  Genitourinary: Negative.   Musculoskeletal: Negative for myalgias.  Neurological: Negative for tremors and headaches.  Endo/Heme/Allergies: Negative.   Psychiatric/Behavioral: Positive for depression. The patient is nervous/anxious and has insomnia.      Mental Status Examination/Evaluation: Objective:  Appearance: Casual and Fairly Groomed  Eye Contact::  Fair  Speech:  Clear and Coherent and Slow  Volume:  Decreased  Mood:  Anxious, Depressed and Irritable  Affect:  Blunt  Thought Process:  Coherent  Orientation:  Full  Thought Content:  Hallucinations: Auditory  Suicidal Thoughts:  Yes.  without intent/plan  Homicidal Thoughts:  No  Memory:  Immediate;   Good Recent;   Good Remote;   Good  Judgement:  Impaired  Insight:   Lacking  Psychomotor Activity:  Increased and Tremor  Concentration:  Poor  Recall:  Fair  Akathisia:  No  Handed:  Right  AIMS (if indicated):     Assets:  Communication Skills Desire for Improvement Physical Health  Sleep:  Number of Hours: 6    Vital Signs:Blood pressure 100/64, pulse 96, temperature 97.3 F (36.3 C), temperature source Oral, resp. rate 16, height 5' 7.5" (1.715 m), weight 85.276 kg (188 lb). Current Medications: Current Facility-Administered Medications  Medication Dose Route Frequency Provider Last Rate Last Dose  . acetaminophen (TYLENOL) tablet 650 mg  650 mg Oral Q6H PRN Kerry Hough, PA   650 mg at 08/30/12 2300  . alum & mag hydroxide-simeth (MAALOX/MYLANTA) 200-200-20 MG/5ML suspension 30 mL  30 mL Oral Q4H PRN Kerry Hough, PA      . benztropine (COGENTIN) tablet 0.5 mg  0.5 mg Oral BID Miabella Shannahan   0.5 mg at 09/01/12 0829  . chlordiazePOXIDE (LIBRIUM) capsule 25 mg  25 mg Oral QID PRN Kerry Hough, PA   25 mg at 09/01/12 0836  . citalopram (CELEXA) tablet 20 mg  20 mg Oral Daily Ashleyann Shoun   20 mg at 09/01/12 0829  . gabapentin (NEURONTIN) capsule 400 mg  400 mg Oral TID Ellawyn Wogan   400 mg at 09/01/12 0829  . haloperidol (HALDOL) tablet 2 mg  2 mg Oral BID Rowland Ericsson   2 mg at 09/01/12 0829  . magnesium hydroxide (MILK OF MAGNESIA) suspension 30 mL  30 mL Oral Daily PRN Kerry Hough, PA      .  nicotine (NICODERM CQ - dosed in mg/24 hours) patch 21 mg  21 mg Transdermal Q0600 Kerry Hough, PA   21 mg at 09/01/12 0646  . traZODone (DESYREL) tablet 100 mg  100 mg Oral QHS,MR X 1 Anarosa Kubisiak   100 mg at 08/31/12 2305    Lab Results:  No results found for this or any previous visit (from the past 48 hour(s)).  Physical Findings: AIMS: Facial and Oral Movements Muscles of Facial Expression: None, normal Lips and Perioral Area: None, normal Jaw: None, normal Tongue: None, normal,Extremity Movements Upper (arms,  wrists, hands, fingers): None, normal Lower (legs, knees, ankles, toes): None, normal, Trunk Movements Neck, shoulders, hips: None, normal, Overall Severity Severity of abnormal movements (highest score from questions above): None, normal Incapacitation due to abnormal movements: None, normal Patient's awareness of abnormal movements (rate only patient's report): No Awareness, Dental Status Current problems with teeth and/or dentures?: No Does patient usually wear dentures?: No  CIWA:  CIWA-Ar Total: 1  COWS:     Treatment Plan Summary: Daily contact with patient to assess and evaluate symptoms and progress in treatment Medication management  Plan:1. Admit for crisis management and stabilization. 2. Medication management to reduce current symptoms to base line and improve the patient's overall level of functioning: I Will discontinue Haloperidol and initiate Seroquel 100mg  po at bedtime for mood stabilization and psychosis, patient reports it worked better for him in the past. I will initiate Clonazepam 0.5mg  po BID to address panic attacks and alcohol withdrawal smyptoms. 3. Treat health problems as indicated. 4. Develop treatment plan to decrease risk of relapse upon discharge and the need for readmission. 5. Psycho-social education regarding relapse prevention and self care. 6. Health care follow up as needed for medical problems. 7. Patient to be allowed to attend AA meeting at [address] [place].    Thedore Mins, MD 09/01/2012, 11:30 AM

## 2012-09-01 NOTE — Progress Notes (Signed)
Patient ID: Alfred Hicks, male   DOB: 03/01/1981, 31 y.o.   MRN: [id number] Patient remains anxious and restless.  He asked for his librium to be increased for his anxiety and he was informed that he was getting the maximum dose prn for withdrawal.  Patient also requested klonopin which has not been ordered from the MD.  He denies any SI/HI/AVH.  He has had librium X1 this morning.    Continue to monitor patient for withdrawal symptoms and anxiety.  Continue with 15 minute safety checks per protocol.  Collaborate with treatment team members regarding patient's POC.  Patient's behavior has been appropriate on the unit.

## 2012-09-01 NOTE — Progress Notes (Signed)
[facility] Adult Inpatient Family/Significant Other Suicide Prevention Education  Suicide Prevention Education:  Education Completed; Kristeen Miss, mother, [phone],  has been identified by the patient as the family member/significant other with whom the patient will be residing, and identified as the person(s) who will aid the patient in the event of a mental health crisis (suicidal ideations/suicide attempt).  With written consent from the patient, the family member/significant other has been provided the following suicide prevention education, prior to the and/or following the discharge of the patient.  The suicide prevention education provided includes the following:  Suicide risk factors  Suicide prevention and interventions  National Suicide Hotline telephone number  [facility] assessment telephone number  [facility] Emergency Assistance 911  [facility] and/or Residential Mobile Crisis Unit telephone number  Request made of family/significant other to:  Remove weapons (e.g., guns, rifles, knives), all items previously/currently identified as safety concern.    Remove drugs/medications (over-the-counter, prescriptions, illicit drugs), all items previously/currently identified as a safety concern.  The family member/significant other verbalizes understanding of the suicide prevention education information provided.  The family member/significant other agrees to remove the items of safety concern listed above.  Alfred Hicks 09/01/2012, 5:22 PM

## 2012-09-01 NOTE — Discharge Planning (Signed)
Pt attended morning aftercare planning group. He reported that his medications were still not working and asked for changes to be made. Pt felt that there was no way he could return to work with his social anxiety and stress levels so high. Pt also mentioned that he made a collage last night depicting his auditory/visual hallucinations for staff to better understand. Pt talked about how drinking had been the only way for him to effectively deal with his anxiety and feels frustrated that medications are not working for him at this time. Pt attended NA meeting on [address] last night. SW told pt that he would discuss medication issues with doctor. Pt attended PM group about "Mindfullness and Balance." He participated in exercise and activity.  Described how he attains balance through symmetry and order, and how this can be challenging when all around him is so chaotic.

## 2012-09-02 MED ORDER — DIPHENHYDRAMINE HCL 25 MG PO CAPS
25.0000 mg | ORAL_CAPSULE | Freq: Four times a day (QID) | ORAL | Status: DC | PRN
  Administered 2012-09-02 – 2012-09-03 (×2): 25 mg via ORAL

## 2012-09-02 MED ORDER — QUETIAPINE FUMARATE 200 MG PO TABS
200.0000 mg | ORAL_TABLET | Freq: Every day | ORAL | Status: DC
  Administered 2012-09-02 – 2012-09-04 (×3): 200 mg via ORAL
  Filled 2012-09-02 (×4): qty 1

## 2012-09-02 NOTE — Progress Notes (Signed)
Patient ID: Alfred Hicks, male   DOB: Aug 14, 1981, 31 y.o.   MRN: [id number] Patient ID: Alfred Hicks, male   DOB: 05/24/1981, 31 y.o.   MRN: [id number] [facility] MD Progress Note  09/02/2012 11:01 AM LATONYA KNIGHT  MRN:  [id number]  Diagnosis:  Major depressive disorder, single episode severe, with psychosis                     Alcohol dependence with alcohol-induced mood disorder  Subjective: " I am feeling less anxious,  Depressed and  Agitated". " I slept better last night".  ADL's:  Intact  Sleep: better  Appetite:good  Suicidal Ideation:  Plan:  denies  Intent:  denies Means:  denies Homicidal Ideation:  Plan:  denies Intent:  denies Means:  denies  Objective: Patient reports that he slept better last night. He reports decreased panic attacks and agitation . Patient reports that his current medication regimen is helpful. Still reports social anxiety in the presence of other people. He reports intermittent auditory hallucination but denies suicidal ideations today. Patient continue to reports his commitment to stop drinking alcohol as a coping mechanism.  Review of Systems  Constitutional: Negative for fever and chills.  Eyes: Negative for blurred vision.  Respiratory: Negative for cough and shortness of breath.   Cardiovascular: Positive for palpitations.  Gastrointestinal: Negative for abdominal pain and diarrhea.  Genitourinary: Negative.   Musculoskeletal: Negative for myalgias.  Neurological: Negative for tremors and headaches.  Endo/Heme/Allergies: Negative.   Psychiatric/Behavioral: Positive for depression. The patient is nervous/anxious and has insomnia.      Mental Status Examination/Evaluation: Objective:  Appearance: Casual and Fairly Groomed  Eye Contact::  Fair  Speech:  Clear and Coherent and Slow  Volume:  Decreased  Mood:  Less anxious/depressed  Affect:  neutral  Thought Process:  Coherent  Orientation:  Full  Thought Content:  Hallucinations:  Auditory  Suicidal Thoughts:  Yes.  without intent/plan  Homicidal Thoughts:  No  Memory:  Immediate;   Good Recent;   Good Remote;   Good  Judgement:  fair  Insight:  marginal  Psychomotor Activity:  Some agitation  Concentration:  fair  Recall:  Fair  Akathisia:  No  Handed:  Right  AIMS (if indicated):     Assets:  Communication Skills Desire for Improvement Physical Health  Sleep:  Number of Hours: 6    Vital Signs:Blood pressure 108/67, pulse 87, temperature 97 F (36.1 C), temperature source Oral, resp. rate 18, height 5' 7.5" (1.715 m), weight 85.276 kg (188 lb). Current Medications: Current Facility-Administered Medications  Medication Dose Route Frequency Provider Last Rate Last Dose  . acetaminophen (TYLENOL) tablet 650 mg  650 mg Oral Q6H PRN Kerry Hough, PA   650 mg at 09/01/12 2002  . alum & mag hydroxide-simeth (MAALOX/MYLANTA) 200-200-20 MG/5ML suspension 30 mL  30 mL Oral Q4H PRN Kerry Hough, PA      . chlordiazePOXIDE (LIBRIUM) capsule 25 mg  25 mg Oral QID PRN Kerry Hough, PA   25 mg at 09/02/12 0820  . citalopram (CELEXA) tablet 20 mg  20 mg Oral Daily Arney Mayabb   20 mg at 09/02/12 0803  . clonazePAM (KLONOPIN) tablet 0.5 mg  0.5 mg Oral BID Yareth Kearse   0.5 mg at 09/02/12 0805  . diphenhydrAMINE (BENADRYL) capsule 25 mg  25 mg Oral Q6H PRN Massimo Hartland      . gabapentin (NEURONTIN) capsule 400 mg  400 mg  Oral TID Karlita Lichtman   400 mg at 09/02/12 0803  . magnesium hydroxide (MILK OF MAGNESIA) suspension 30 mL  30 mL Oral Daily PRN Kerry Hough, PA      . nicotine (NICODERM CQ - dosed in mg/24 hours) patch 21 mg  21 mg Transdermal Q0600 Kerry Hough, PA   21 mg at 09/01/12 0646  . QUEtiapine (SEROQUEL) tablet 200 mg  200 mg Oral QHS Damari Suastegui      . [DISCONTINUED] benztropine (COGENTIN) tablet 0.5 mg  0.5 mg Oral BID Mamadou Breon   0.5 mg at 09/01/12 0829  . [DISCONTINUED] haloperidol (HALDOL) tablet 2 mg  2 mg Oral BID  Jylian Pappalardo   2 mg at 09/01/12 0829  . [DISCONTINUED] QUEtiapine (SEROQUEL) tablet 100 mg  100 mg Oral QHS Rachelann Enloe   100 mg at 09/01/12 2146  . [DISCONTINUED] traZODone (DESYREL) tablet 100 mg  100 mg Oral QHS,MR X 1 David Towson   100 mg at 09/01/12 2253    Lab Results:  No results found for this or any previous visit (from the past 48 hour(s)).  Physical Findings: AIMS: Facial and Oral Movements Muscles of Facial Expression: None, normal Lips and Perioral Area: None, normal Jaw: None, normal Tongue: None, normal,Extremity Movements Upper (arms, wrists, hands, fingers): None, normal Lower (legs, knees, ankles, toes): None, normal, Trunk Movements Neck, shoulders, hips: None, normal, Overall Severity Severity of abnormal movements (highest score from questions above): None, normal Incapacitation due to abnormal movements: None, normal Patient's awareness of abnormal movements (rate only patient's report): No Awareness, Dental Status Current problems with teeth and/or dentures?: No Does patient usually wear dentures?: No  CIWA:  CIWA-Ar Total: 1  COWS:     Treatment Plan Summary: Daily contact with patient to assess and evaluate symptoms and progress in treatment Medication management  Plan:1. Admit for crisis management and stabilization. 2. Medication management to reduce current symptoms to base line and improve the patient's overall level of functioning: I Will increase  Seroquel to 200mg  po at bedtime for mood stabilization and psychosis, my goal is to titrate Seroquel up to at least 300-400mg  as tolerated by the patient. I will continue Clonazepam 0.5mg  po BID to address panic attacks and alcohol withdrawal smyptoms. I will discontinue Librium prn today, as patient's anxiety seems to be under control. 3. Develop treatment plan to decrease risk of relapse upon discharge and the need for readmission. 4. Psycho-social education regarding relapse prevention and self  care. 5. Patient to be allowed to attend AA meeting at [address].    Thedore Mins, MD 09/02/2012, 11:01 AM

## 2012-09-02 NOTE — Progress Notes (Signed)
Patient ID: Alfred Hicks, male   DOB: 12/10/1980, 31 y.o.   MRN: [id number]  D:  Patient appears anxious and  and depressed. Pt continues to be preoccupied with his anxiety Patient is very cooperative, but exhibits drug seeking behaviors.   Pt denies SI/HI. Pt does complain of AH, but says they are non violent and not telling him to hurt anyone or himself. Pt complained of tooth ache, he said "I chipped my tooth at dinner on a chicken bone".    A: Safety  maintained with Q15 min  checks. Support and encouragement provided. Pt given tylenol at 2002 for tooth pain. Pt given librium per order for anxiety at 2253.  R: Patient remains safe. He is complaint with medication. Safety has been maintained Q15 and continue current POC. Pt states" tylenol did not change tooth pain, but the pain is tolerable, I don't need anything for it tonight. Pt anxiety decreased through the night. Pt slept most of the night.

## 2012-09-02 NOTE — Progress Notes (Signed)
Patient ID: Alfred Hicks, male   DOB: [birth year], 31 y.o.   MRN: [id number] [facility] MD Progress Note  09/02/2012 11:14 AM Alfred Hicks  MRN:  [id number]  Diagnosis:  Major depressive disorder, single episode severe, with psychosis                     Alcohol dependence with alcohol-induced mood disorder  Subjective: " I am feeling less anxious,  Depressed and  Agitated". " I slept better last night".  ADL's:  Intact  Sleep: better  Appetite:good  Suicidal Ideation:  Plan:  denies  Intent:  denies Means:  denies Homicidal Ideation:  Plan:  denies Intent:  denies Means:  denies  Objective: Patient reports that he slept better last night. He reports decreased panic attacks and agitation . Patient reports that his current medication regimen is helpful. Still reports social anxiety in the presence of other people. He reports intermittent auditory hallucination but denies suicidal ideations today. Patient continue to reports his commitment to stop drinking alcohol as a coping mechanism.  Review of Systems  Constitutional: Negative for fever and chills.  Eyes: Negative for blurred vision.  Respiratory: Negative for cough and shortness of breath.   Cardiovascular: Positive for palpitations.  Gastrointestinal: Negative for abdominal pain and diarrhea.  Genitourinary: Negative.   Musculoskeletal: Negative for myalgias.  Neurological: Negative for tremors and headaches.  Endo/Heme/Allergies: Negative.   Psychiatric/Behavioral: Positive for depression. The patient is nervous/anxious and has insomnia.      Mental Status Examination/Evaluation: Objective:  Appearance: Casual and Fairly Groomed  Eye Contact::  Fair  Speech:  Clear and Coherent and Slow  Volume:  Decreased  Mood:  Less anxious/depressed  Affect:  neutral  Thought Process:  Coherent  Orientation:  Full  Thought Content:  Hallucinations: Auditory  Suicidal Thoughts:  Yes.  without intent/plan  Homicidal Thoughts:   No  Memory:  Immediate;   Good Recent;   Good Remote;   Good  Judgement:  fair  Insight:  marginal  Psychomotor Activity:  Some agitation  Concentration:  fair  Recall:  Fair  Akathisia:  No  Handed:  Right  AIMS (if indicated):     Assets:  Communication Skills Desire for Improvement Physical Health  Sleep:  Number of Hours: 6    Vital Signs:Blood pressure 108/67, pulse 87, temperature 97 F (36.1 C), temperature source Oral, resp. rate 18, height 5' 7.5" (1.715 m), weight 85.276 kg (188 lb). Current Medications: Current Facility-Administered Medications  Medication Dose Route Frequency Provider Last Rate Last Dose  . acetaminophen (TYLENOL) tablet 650 mg  650 mg Oral Q6H PRN Kerry Hough, PA   650 mg at 09/01/12 2002  . alum & mag hydroxide-simeth (MAALOX/MYLANTA) 200-200-20 MG/5ML suspension 30 mL  30 mL Oral Q4H PRN Kerry Hough, PA      . citalopram (CELEXA) tablet 20 mg  20 mg Oral Daily Yianna Tersigni   20 mg at 09/02/12 0803  . clonazePAM (KLONOPIN) tablet 0.5 mg  0.5 mg Oral BID Mikelle Myrick   0.5 mg at 09/02/12 0805  . diphenhydrAMINE (BENADRYL) capsule 25 mg  25 mg Oral Q6H PRN Dorrine Montone      . gabapentin (NEURONTIN) capsule 400 mg  400 mg Oral TID Uyen Eichholz   400 mg at 09/02/12 0803  . magnesium hydroxide (MILK OF MAGNESIA) suspension 30 mL  30 mL Oral Daily PRN Kerry Hough, PA      . nicotine (NICODERM CQ -  dosed in mg/24 hours) patch 21 mg  21 mg Transdermal Q0600 Kerry Hough, PA   21 mg at 09/01/12 0646  . QUEtiapine (SEROQUEL) tablet 200 mg  200 mg Oral QHS Wayburn Shaler      . [DISCONTINUED] benztropine (COGENTIN) tablet 0.5 mg  0.5 mg Oral BID Mandalyn Pasqua   0.5 mg at 09/01/12 0829  . [DISCONTINUED] chlordiazePOXIDE (LIBRIUM) capsule 25 mg  25 mg Oral QID PRN Kerry Hough, PA   25 mg at 09/02/12 0820  . [DISCONTINUED] haloperidol (HALDOL) tablet 2 mg  2 mg Oral BID Alayzia Pavlock   2 mg at 09/01/12 0829  . [DISCONTINUED]  QUEtiapine (SEROQUEL) tablet 100 mg  100 mg Oral QHS Silver Achey   100 mg at 09/01/12 2146  . [DISCONTINUED] traZODone (DESYREL) tablet 100 mg  100 mg Oral QHS,MR X 1 Jess Toney   100 mg at 09/01/12 2253    Lab Results:  No results found for this or any previous visit (from the past 48 hour(s)).  Physical Findings: AIMS: Facial and Oral Movements Muscles of Facial Expression: None, normal Lips and Perioral Area: None, normal Jaw: None, normal Tongue: None, normal,Extremity Movements Upper (arms, wrists, hands, fingers): None, normal Lower (legs, knees, ankles, toes): None, normal, Trunk Movements Neck, shoulders, hips: None, normal, Overall Severity Severity of abnormal movements (highest score from questions above): None, normal Incapacitation due to abnormal movements: None, normal Patient's awareness of abnormal movements (rate only patient's report): No Awareness, Dental Status Current problems with teeth and/or dentures?: No Does patient usually wear dentures?: No  CIWA:  CIWA-Ar Total: 1  COWS:     Treatment Plan Summary: Daily contact with patient to assess and evaluate symptoms and progress in treatment Medication management  Plan:1. Admit for crisis management and stabilization. 2. Medication management to reduce current symptoms to base line and improve the patient's overall level of functioning: I Will increase  Seroquel to 200mg  po at bedtime for mood stabilization and psychosis, my goal is to titrate Seroquel up to at least 300-400mg  as tolerated by the patient. I will continue Clonazepam 0.5mg  po BID to address panic attacks and alcohol withdrawal smyptoms. I will discontinue Librium prn today, as patient's anxiety seems to be under control. 3. Develop treatment plan to decrease risk of relapse upon discharge and the need for readmission. 4. Psycho-social education regarding relapse prevention and self care. 5. Patient to be allowed to attend AA meeting at [address].    Thedore Mins, MD 09/02/2012, 11:14 AM

## 2012-09-02 NOTE — Progress Notes (Signed)
Alfred Hicks  Is seen out in the milieu. He is quiet, pleasant.Remains a little guarded, but cooperative upon approach. He reports he is sleeping better .    A He completed his AM self inventory and on it he wrote he denied SI, he rated his depression and hopelessness "4/3" and he stated his DC plan is to " stay away from his friends".   R Safety is in place and POC cont with therapeutic relationship fostered PD RN [facility]

## 2012-09-02 NOTE — Progress Notes (Signed)
Psychoeducational Group Note  Date:  09/02/2012 Time:  2000  Group Topic/Focus:  Wrap-Up Group:   The focus of this group is to help patients review their daily goal of treatment and discuss progress on daily workbooks.  Participation Level:  Did Not Attend  Participation Quality:    Affect:    Cognitive:    Insight:    Engagement in Group:    Additional Comments:  Pt didn't attend wrap-up group. Pt attended Am group on [address].   Alfred Hicks A 09/02/2012, 9:52 PM

## 2012-09-02 NOTE — Progress Notes (Signed)
Psychoeducational Group Note  Date:  09/02/2012 Time:  1315  Group Topic/Focus:  Relapse Prevention Planning:   The focus of this group is to define relapse and discuss the need for planning to combat relapse.  Participation Level:  Active  Participation Quality:  Appropriate, Sharing and Supportive  Affect:  Appropriate  Cognitive:  Appropriate  Insight:  Good  Engagement in Group:  Good  Additional Comments: none  Jefferie Holston M 09/02/2012, 2:05 PM

## 2012-09-02 NOTE — Treatment Plan (Signed)
Interdisciplinary Treatment Plan Update (Adult)  Date: 09/02/2012  Time Reviewed: 9:50 AM   Progress in Treatment: Attending groups: Yes Participating in groups: Yes Taking medication as prescribed: Yes Tolerating medication: Yes   Family/Significant other contact made:   Patient understands diagnosis:  Yes Discussing patient identified problems/goals with staff:  Yes Medical problems stabilized or resolved:  Yes Denies suicidal/homicidal ideation: Yes  In tx team Issues/concerns per patient self-inventory:   Other:  New problem(s) identified: N/A  Reason for Continuation of Hospitalization: Anxiety Medication stabilization  Interventions implemented related to continuation of hospitalization:  Increase Seroquel, D/C prn librium  Encourage group attendance and participation  Additional comments:Alfred Hicks reports good results from the first dose of seroquel last night  Estimated length of stay:3 days  Discharge Plan:Return home, likely d/c Mon  See below  New goal(s): N/A  Review of initial/current patient goals per problem list:   1.  Goal(s): Eliminate SI  Met:  Yes  Target date:See previous tx plan  As evidenced by:  2.  Goal (s): Stabilize mood  Met:  No  Target date:11/11  As evidenced by: [name] will rate his depression and anxiety at a 3 or less, and that his anger and impulsiveness feel manageable  3.  Goal(s):Identify comprehensive sobriety and mental wellness  Met:  Yes  Target date:11/8  As evidenced by:Follow up at [facility] and attend daily AA mtgs.  4.  Goal(s):  Met:  No  Target date:  As evidenced by:  Attendees: Patient:  Alfred Hicks 09/02/2012 9:50 AM  Family:     Physician:  Thedore Mins 09/02/2012 9:50 AM   Nursing: Nestor Ramp   09/02/2012 9:50 AM   Clinical Social Worker:  Richelle Ito 09/02/2012 9:50 AM   Extender:  Verne Spurr PA 09/02/2012 9:50 AM   Other:     Other:     Other:     Other:      Scribe for Treatment Team:     Ida Rogue, 09/02/2012 9:50 AM

## 2012-09-02 NOTE — Discharge Planning (Signed)
Alfred Hicks attended AM group.  Reported he was doing much better due to addition of Seroquel last night.  Hopeful that his mood will be stabilized by [facility] so he can d/c then. Alfred Hicks attended second group on Relapse prevention.  He was engaged and active.  Spoke at length about challenges with anger and escalating out of control.  Open to therapy, as well as practicing mindfullness techniques. Will look for app for cell phone.
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
## 2012-09-03 DIAGNOSIS — F329 Major depressive disorder, single episode, unspecified: Secondary | ICD-10-CM

## 2012-09-03 DIAGNOSIS — F1994 Other psychoactive substance use, unspecified with psychoactive substance-induced mood disorder: Secondary | ICD-10-CM

## 2012-09-03 DIAGNOSIS — F1010 Alcohol abuse, uncomplicated: Secondary | ICD-10-CM

## 2012-09-03 MED ORDER — NICOTINE POLACRILEX 2 MG MT GUM
2.0000 mg | CHEWING_GUM | OROMUCOSAL | Status: DC | PRN
  Administered 2012-09-03 – 2012-09-05 (×4): 2 mg via ORAL
  Filled 2012-09-03: qty 1

## 2012-09-03 MED ORDER — HYDROXYZINE HCL 25 MG PO TABS
25.0000 mg | ORAL_TABLET | Freq: Three times a day (TID) | ORAL | Status: DC | PRN
  Administered 2012-09-03 – 2012-09-04 (×4): 25 mg via ORAL

## 2012-09-03 NOTE — Progress Notes (Signed)
[facility] MD Progress Note  09/03/2012 12:23 PM Alfred Hicks  MRN:  [id number]  Diagnosis:   Axis I: Alcohol Abuse, Major Depression, single episode and Substance Induced Mood Disorder Axis II: Deferred Axis III:  Past Medical History  Diagnosis Date  . Anxiety   . Mental disorder   . Depression    Axis IV: economic problems, occupational problems, other psychosocial or environmental problems, problems related to legal system/crime, problems related to social environment and problems with primary support group Axis V: 21-30 behavior considerably influenced by delusions or hallucinations OR serious impairment in judgment, communication OR inability to function in almost all areas  ADL's:  Intact  Sleep: Fair  Appetite:  Fair  Suicidal Ideation:  Denies Homicidal Ideation:  Denies  Mental Status Examination/Evaluation: Objective:  Appearance: Casual  Eye Contact::  Minimal  Speech:  Normal Rate  Volume:  Decreased  Mood:  Depressed  Affect:  Depressed  Thought Process:  Intact  Orientation:  Full  Thought Content:  Hallucinations: Auditory Visual  Suicidal Thoughts:  No  Homicidal Thoughts:  No  Memory:  Immediate;   Fair Recent;   Fair Remote;   Poor  Judgement:  Impaired  Insight:  Lacking  Psychomotor Activity:  Decreased  Concentration:  Fair  Recall:  Fair  Akathisia:  No  Handed:  Right  AIMS (if indicated):     Assets:  Desire for Improvement Physical Health  Sleep:  Number of Hours: 5.25    Vital Signs:Blood pressure 112/72, pulse 94, temperature 97.2 F (36.2 C), temperature source Oral, resp. rate 16, height 5' 7.5" (1.715 m), weight 85.276 kg (188 lb). Current Medications: Current Facility-Administered Medications  Medication Dose Route Frequency Provider Last Rate Last Dose  . acetaminophen (TYLENOL) tablet 650 mg  650 mg Oral Q6H PRN Kerry Hough, PA   650 mg at 09/03/12 0826  . alum & mag hydroxide-simeth (MAALOX/MYLANTA) 200-200-20 MG/5ML  suspension 30 mL  30 mL Oral Q4H PRN Kerry Hough, PA      . citalopram (CELEXA) tablet 20 mg  20 mg Oral Daily Mojeed Akintayo   20 mg at 09/03/12 7253  . clonazePAM (KLONOPIN) tablet 0.5 mg  0.5 mg Oral BID Mojeed Akintayo   0.5 mg at 09/03/12 6644  . diphenhydrAMINE (BENADRYL) capsule 25 mg  25 mg Oral Q6H PRN Mojeed Akintayo   25 mg at 09/03/12 0823  . gabapentin (NEURONTIN) capsule 400 mg  400 mg Oral TID Mojeed Akintayo   400 mg at 09/03/12 1159  . hydrOXYzine (ATARAX/VISTARIL) tablet 25 mg  25 mg Oral TID PRN Nanine Means, NP      . magnesium hydroxide (MILK OF MAGNESIA) suspension 30 mL  30 mL Oral Daily PRN Kerry Hough, PA      . nicotine polacrilex (NICORETTE) gum 2 mg  2 mg Oral PRN Mojeed Akintayo   2 mg at 09/03/12 0347  . QUEtiapine (SEROQUEL) tablet 200 mg  200 mg Oral QHS Mojeed Akintayo   200 mg at 09/02/12 2111  . [DISCONTINUED] nicotine (NICODERM CQ - dosed in mg/24 hours) patch 21 mg  21 mg Transdermal Q0600 Kerry Hough, PA   21 mg at 09/01/12 4259    Lab Results: No results found for this or any previous visit (from the past 48 hour(s)).  Physical Findings: AIMS: Facial and Oral Movements Muscles of Facial Expression: None, normal Lips and Perioral Area: None, normal Jaw: None, normal Tongue: None, normal,Extremity Movements Upper (arms, wrists, hands,  fingers): None, normal Lower (legs, knees, ankles, toes): None, normal, Trunk Movements Neck, shoulders, hips: None, normal, Overall Severity Severity of abnormal movements (highest score from questions above): None, normal Incapacitation due to abnormal movements: None, normal Patient's awareness of abnormal movements (rate only patient's report): No Awareness, Dental Status Current problems with teeth and/or dentures?: No Does patient usually wear dentures?: No  CIWA:  CIWA-Ar Total: 1  COWS:     Treatment Plan Summary: Daily contact with patient to assess and evaluate symptoms and progress in  treatment Medication management  Plan:  Individual and group therapy, medication management for his auditory and visual hallucinations, denies pain, hallucinations improving per patient report, requested better anxiety medication management around noon to assist his depression--vistaril PRN ordered, monitoring continues.  Nanine Means, NP 09/03/2012, 12:23 PM

## 2012-09-03 NOTE — Clinical Social Work Psych Note (Signed)
[facility] Group Notes:  (Counselor/Nursing/MHT/Case Management/Adjunct)  09/03/2012   Type of Therapy:  Group Therapy  Participation Level:  Active  Participation Quality:  Appropriate  Affect:  Appropriate  Cognitive:  Alert  Insight:  Good  Engagement in Group:  Good  Engagement in Therapy:  Good  Modes of Intervention:  Socialization, Support, Clarification and Education  Summary of Progress/Problems: The main focus of the process group was for the patient to identify self-sabatoging behaviors that they engage in the hinder them from meeting or maintaining their goals. The patient stated that he drinks to much and has done too many drugs in the past which now causes him to experience audio and visual hallucinations.    Alfred Hicks [name], [place] 09/03/2012 12:49 PM

## 2012-09-03 NOTE — Progress Notes (Signed)
Psychoeducational Group Note  Date:  09/03/2012 Time:  0945 am  Group Topic/Focus:  Identifying Needs:   The focus of this group is to help patients identify their personal needs that have been historically problematic and identify healthy behaviors to address their needs.  Participation Level:  Active  Participation Quality:  Appropriate and Sharing  Affect:  Appropriate  Cognitive:  Alert  Insight:  Good  Engagement in Group:  Good  Additional Comments:    Andrena Mews 09/03/2012,12:03 PM

## 2012-09-03 NOTE — Progress Notes (Signed)
Psychoeducational Group Note  Date:  09/03/2012 Time:  1515  Group Topic/Focus:  Self Care:   The focus of this group is to help patients understand the importance of self-care in order to improve or restore emotional, physical, spiritual, interpersonal, and financial health.  Participation Level:  Active  Participation Quality:  Appropriate and Attentive  Affect:  Appropriate  Cognitive:  Alert and Appropriate  Insight:  Good  Engagement in Group:  Good  Additional Comments:  Pt participated in group.  Marquis Lunch, Dolores Ewing 09/03/2012, 7:21 PM

## 2012-09-03 NOTE — Progress Notes (Addendum)
Patient ID: Alfred Hicks, male   DOB: 01-06-1981, 31 y.o.   MRN: [id number] D. The patient has an anxious mood and affect. He request to attend the substance abuse group on the 300 hall. When he returned he appeared restless and preoccupied.  A. Met with patient 1:1. Administered HS medications. R. Admits to auditory hallucinations. Very guarded and forwards little information regarding events that led up to hospitalization. Could not explain his auditory hallucinations or what he was experiencing. Compliant with medications. Denies suicidal ideation at this time and contracts for safety, but states he frequently feels suicidal.

## 2012-09-03 NOTE — Progress Notes (Signed)
Psychoeducational Group Note  Date:  09/03/2012 Time:  2000  Group Topic/Focus:  Wrap-Up Group:   The focus of this group is to help patients review their daily goal of treatment and discuss progress on daily workbooks.  Participation Level:  Active  Participation Quality:  Appropriate  Affect:  Appropriate  Cognitive:  Appropriate  Insight:  Good  Engagement in Group:  Good  Additional Comments:  Patient attended and participated in group tonight. He reports that his day started out good, however, when he went to the gym he had a panic attack. Patient reports that he copes by reading, isolating himself, and listening to music.  Lita Mains [facility] 09/03/2012, 10:01 PM

## 2012-09-03 NOTE — Progress Notes (Signed)
Psychoeducational Group Note  Date:  09/03/2012 Time:  0900  Group Topic/Focus:  Self Inventory  Participation Level:  Active  Participation Quality:  Attentive, Sharing and Supportive  Affect:  Appropriate  Cognitive:  Alert and Appropriate  Insight:  Good  Engagement in Group:  Good  Additional Comments:    Alfred Hicks Alfred Hicks 09/03/2012, 9:53 AM

## 2012-09-03 NOTE — Progress Notes (Signed)
Patient ID: Alfred Hicks, male   DOB: 01-17-1981, 31 y.o.   MRN: [id number] D: Pt is awake and active on the unit this AM. Pt denies SI/HI but he endorses A/V hallucinations. He has difficulty explaining the nature of his hallucinations, but he has explained it to his MD in the past. Pt is participating in the milieu and is cooperative with staff. Pt rates their depression at 3 and hopelessness at 3. Pt writes that he wants to know what staff is doing about his hallucinations.   A: Writer utilized therapeutic communication, encouraged pt to discuss feelings with staff and administered medication per MD orders. Writer also encouraged pt to attend groups. Writer discussed his medication regimen. He admits that the Seroquel is helping some and that the dose is being adjusted gradually for optimum therapeutic value. Pt also c/o a broken tooth on LU side due to a chicken bone in the cafeteria.   R: Pt is attending groups and tolerating medications well. Writer will continue to monitor. 15 minute checks are ongoing for safety.

## 2012-09-04 DIAGNOSIS — F323 Major depressive disorder, single episode, severe with psychotic features: Principal | ICD-10-CM

## 2012-09-04 MED ORDER — TRAZODONE HCL 100 MG PO TABS
100.0000 mg | ORAL_TABLET | Freq: Every day | ORAL | Status: DC
  Administered 2012-09-04: 100 mg via ORAL
  Filled 2012-09-04 (×3): qty 1

## 2012-09-04 MED ORDER — QUETIAPINE FUMARATE 50 MG PO TABS
50.0000 mg | ORAL_TABLET | ORAL | Status: DC
  Administered 2012-09-04 – 2012-09-05 (×4): 50 mg via ORAL
  Filled 2012-09-04 (×8): qty 1

## 2012-09-04 MED ORDER — QUETIAPINE FUMARATE 50 MG PO TABS: 50.0000 mg | ORAL_TABLET | Freq: Three times a day (TID) | ORAL | Status: DC

## 2012-09-04 NOTE — Progress Notes (Signed)
Patient did attend the evening speaker AA meeting.  

## 2012-09-04 NOTE — Progress Notes (Signed)
Patient ID: Alfred Hicks, male   DOB: [birth year], 31 y.o.   MRN: [id number] D: Pt is awake and active on the unit this AM. Pt denies SI/HI but endorses A/V hallucinations. Pt is participating in the milieu and is cooperative with staff. Pt rates their depression at 2 and hopelessness at 2. Pt's mood is anxious/ appropriate and his affect is appropriate. Pt states that he is feeling better than yesterday, but he is not sure why. He also slept well last night, but he would like to go home.   A: Writer utilized therapeutic communication, encouraged pt to discuss feelings with staff and administered medication per MD orders. Writer also encouraged pt to attend groups. Writer informed pt that he is feeling better due to rest and the therapeutic effect of his medications. Writer also encouraged pt to follow his tx regimen after discharge if he hopes to maintain his progress.    R: Pt is attending groups and tolerating medications well. Writer will continue to monitor. 15 minute checks are ongoing for safety. Pt is receptive to staff input and states that he plans to continue with his medication regimen.

## 2012-09-04 NOTE — Progress Notes (Signed)
Met with pt 1:1 who again reports having a difficult afternoon. "I just lost it." Pt unable to identify trigger and states "the coping skills y'all have taught me didn't work." Pointed out to pt that he did not harm self, others or property and per his hx this is progress. Pt asking for trazadone to aid in sleep as it was helpful his first night here. On call PA paged, order received and given. Will reassess for effectiveness. He endorses AVH - "that's an everyday thing" - but is able to deny SI/HI. Again reports hope he will be d/c tomorrow. Lawrence Marseilles

## 2012-09-04 NOTE — Progress Notes (Signed)
[facility] MD Progress Note  09/04/2012 5:14 PM Alfred Hicks  MRN:  [id number]  Diagnosis:  Major depressive disorder, single episode, severe, specified as with psychotic behavior   ADL's:  Intact  Sleep: Good  Appetite:  Fair  Suicidal Ideation:  denies Homicidal Ideation:  Denies   AEB (as evidenced by): Subjective: Met with patient 1:1 today who notes that he is anxious and irritable from 1-5 each day and he has no reason why.  He notes a history of multiple job losses for "losing it" while working.  He also reports that his mother is bipolar. Mental Status Examination/Evaluation: Objective:  Appearance: Casual  Eye Contact::  Fair  Speech:  Normal Rate  Volume:  Normal  Mood:  Anxious, Depressed and Irritable  Affect:  Labile  Thought Process:  Goal Directed  Orientation:  Full  Thought Content:  WDL  Suicidal Thoughts:  No  Homicidal Thoughts:  No  Memory:  Immediate;   Fair Recent;   Fair Remote;   Fair  Judgement:  Poor  Insight:  Lacking  Psychomotor Activity:  Decreased  Concentration:  Poor  Recall:  Poor  Akathisia:  No  Handed:    AIMS (if indicated):     Assets:  Desire for Improvement Social Support Vocational/Educational  Sleep:  Number of Hours: 5.75    Vital Signs:Blood pressure 116/81, pulse 78, temperature 96.6 F (35.9 C), temperature source Oral, resp. rate 20, height 5' 7.5" (1.715 m), weight 85.276 kg (188 lb). Current Medications: Current Facility-Administered Medications  Medication Dose Route Frequency Provider Last Rate Last Dose  . acetaminophen (TYLENOL) tablet 650 mg  650 mg Oral Q6H PRN Kerry Hough, PA   650 mg at 09/03/12 0826  . alum & mag hydroxide-simeth (MAALOX/MYLANTA) 200-200-20 MG/5ML suspension 30 mL  30 mL Oral Q4H PRN Kerry Hough, PA      . citalopram (CELEXA) tablet 20 mg  20 mg Oral Daily Mojeed Akintayo   20 mg at 09/04/12 0815  . clonazePAM (KLONOPIN) tablet 0.5 mg  0.5 mg Oral BID Mojeed Akintayo   0.5 mg at  09/04/12 1711  . diphenhydrAMINE (BENADRYL) capsule 25 mg  25 mg Oral Q6H PRN Mojeed Akintayo   25 mg at 09/03/12 0823  . gabapentin (NEURONTIN) capsule 400 mg  400 mg Oral TID Mojeed Akintayo   400 mg at 09/04/12 1712  . hydrOXYzine (ATARAX/VISTARIL) tablet 25 mg  25 mg Oral TID PRN Nanine Means, NP   25 mg at 09/04/12 1304  . magnesium hydroxide (MILK OF MAGNESIA) suspension 30 mL  30 mL Oral Daily PRN Kerry Hough, PA      . nicotine polacrilex (NICORETTE) gum 2 mg  2 mg Oral PRN Mojeed Akintayo   2 mg at 09/04/12 1305  . QUEtiapine (SEROQUEL) tablet 200 mg  200 mg Oral QHS Mojeed Akintayo   200 mg at 09/03/12 2125  . QUEtiapine (SEROQUEL) tablet 50 mg  50 mg Oral [id number] Verne Spurr, PA-C   50 mg at 09/04/12 1513  . [DISCONTINUED] QUEtiapine (SEROQUEL) tablet 50 mg  50 mg Oral TID Verne Spurr, PA-C        Lab Results: No results found for this or any previous visit (from the past 48 hour(s)).  Physical Findings: AIMS: Facial and Oral Movements Muscles of Facial Expression: None, normal Lips and Perioral Area: None, normal Jaw: None, normal Tongue: None, normal,Extremity Movements Upper (arms, wrists, hands, fingers): None, normal Lower (legs, knees, ankles, toes): None, normal,  Trunk Movements Neck, shoulders, hips: None, normal, Overall Severity Severity of abnormal movements (highest score from questions above): None, normal Incapacitation due to abnormal movements: None, normal Patient's awareness of abnormal movements (rate only patient's report): No Awareness, Dental Status Current problems with teeth and/or dentures?: No Does patient usually wear dentures?: No  CIWA:  CIWA-Ar Total: 1  COWS:     Treatment Plan Summary: Daily contact with patient to assess and evaluate symptoms and progress in treatment Medication management  Plan: 1. Seroquel is added at 50mg  TID as scheduled for daytime anxiety. 2. Would consider adding Lithium for mood stabilization. 3.  Continues to display medication seeking behaviors and poor coping ability. 4. Will continue current plan with a goal of discharge in 2 days if symptoms respond to this current change in plan.  Rona Ravens. Rea Reser PAC 09/04/2012, 5:14 PM

## 2012-09-04 NOTE — Progress Notes (Signed)
Psychoeducational Group Note  Date:  09/04/2012 Time:  0900  Group Topic/Focus:  Self Inventory  Participation Level:  Active  Participation Quality:  Appropriate, Attentive, Sharing and Supportive  Affect:  Appropriate  Cognitive:  Alert, Appropriate and Oriented  Insight:  Good  Engagement in Group:  Good  Additional Comments:  Pt states that his symptoms are improving since coming to [facility].  Dominica Kent Shari Prows 09/04/2012, 10:01 AM

## 2012-09-04 NOTE — Progress Notes (Signed)
Psychoeducational Group Note  Date:  09/04/2012 Time:  0945 am  Group Topic/Focus:  Making Healthy Choices:   The focus of this group is to help patients identify negative/unhealthy choices they were using prior to admission and identify positive/healthier coping strategies to replace them upon discharge.  Participation Level:  Did Not Attend    Ulanda Tackett J 09/04/2012, 10:29 AM  

## 2012-09-04 NOTE — Progress Notes (Signed)
Met with pt 1:1 who remains quite anxious in affect and mood. He asks and receives vistaril prn though reports it is not effective. States he suffered a panic attack today while returning from the gym/rec time. He states he was overwhelmed by the number of people on the elevator but was able to get through it. When asked about hallucinations he responds, "yes, that's a constant, everyday thing." Emotional support and reassurance given. Medicated without difficulty. He denies SI/HI and remains safe. No physical pain or problems. Lawrence Marseilles

## 2012-09-04 NOTE — Clinical Social Work Note (Signed)
[facility] Group Notes:  (Clinical Social Work)  09/04/2012   11:15-11:45AM  Summary of Progress/Problems:   The main focus of today's process group was for the patient to identify their current support system and decide on other supports that can be put in place to prevent future hospitalizations.  An emphasis was placed on using therapist, doctor and problem-specific support groups to expand supports. The patient expressed that his family, which consists of his mother and her boyfriend, are his main support and the reason that this works for him is that they do not enable him.  He lives in an in-law apartment separate from their main house.  He was open to the discussion about support groups.  Type of Therapy:  Group Therapy  Participation Level:  Minimal  Participation Quality:  Appropriate and Attentive  Affect:  Blunted and Flat  Cognitive:  Alert and Appropriate  Insight:  Good  Engagement in Group:  Limited  Engagement in Therapy:  Limited  Modes of Intervention:  Clarification, Education, Limit-setting, Problem-solving, Socialization, Support and Processing   Ambrose Mantle, LCSW 09/04/2012, 12:32 PM

## 2012-09-05 MED ORDER — TRAZODONE HCL 100 MG PO TABS: 100.0000 mg | ORAL_TABLET | Freq: Every day | ORAL | Status: DC

## 2012-09-05 MED ORDER — GABAPENTIN 400 MG PO CAPS: 400.0000 mg | ORAL_CAPSULE | Freq: Three times a day (TID) | ORAL | Status: DC

## 2012-09-05 MED ORDER — QUETIAPINE FUMARATE 200 MG PO TABS: 200.0000 mg | ORAL_TABLET | Freq: Every day | ORAL | Status: DC

## 2012-09-05 MED ORDER — QUETIAPINE FUMARATE 50 MG PO TABS: 50.0000 mg | ORAL_TABLET | ORAL | Status: DC

## 2012-09-05 MED ORDER — CITALOPRAM HYDROBROMIDE 20 MG PO TABS: 20.0000 mg | ORAL_TABLET | Freq: Every day | ORAL | Status: DC

## 2012-09-05 MED ORDER — CLONAZEPAM 0.5 MG PO TABS: 0.5000 mg | ORAL_TABLET | Freq: Two times a day (BID) | ORAL | Status: DC

## 2012-09-05 NOTE — Progress Notes (Signed)
Psychoeducational Group Note  Date:  09/05/2012 Time:  1100  Group Topic/Focus:  Wellness Toolbox:   The focus of this group is to discuss various aspects of wellness, balancing those aspects and exploring ways to increase the ability to experience wellness.  Patients will create a wellness toolbox for use upon discharge.  Participation Level:  Active  Participation Quality:  Appropriate, Sharing and Supportive  Affect:  Appropriate  Cognitive:  Appropriate  Insight:  Good  Engagement in Group:  Good  Additional Comments:  none  Ryheem Jay, Genia Plants 09/05/2012, 12:27 PM

## 2012-09-05 NOTE — Progress Notes (Signed)
Patient ID: Alfred Hicks, male   DOB: April 02, 1981, 31 y.o.   MRN: [id number] Patient is interacting well with staff and others on the unit.  He met with treatment team members and discussed his plans for discharge today.  He reports that he feels his treatment went well; he feels less anxiety and the medications have worked well for him.  He plans to return to his mother's home and follow up with [facility] mental health.  Patient denies any SI/HI/AVH.    Initiate discharge paperwork and review same with patient.  Have patient sign consent forms if needed and provide patient with medication samples and pertinent copies of discharge instructions.  Answer any questions that patient may have concerning discharge.  Patient has been appropriate on the unit today.

## 2012-09-05 NOTE — [facility] Suicide Risk Assessment (Signed)
Suicide Risk Assessment  Discharge Assessment     Demographic Factors:  Male  Mental Status Per Nursing Assessment::   On Admission:  NA (currently denies)  Current Mental Status by Physician: Patient denies suicidal ideations, intent or plan  Loss Factors: Decrease in vocational status  Historical Factors: NA  Risk Reduction Factors:   Living with another person, especially a relative, Positive social support and Positive therapeutic relationship  Continued Clinical Symptoms:  Panic Attacks  Cognitive Features That Contribute To Risk:  N/A    Suicide Risk:  Minimal: No identifiable suicidal ideation.  Patients presenting with no risk factors but with morbid ruminations; may be classified as minimal risk based on the severity of the depressive symptoms  Discharge Diagnoses:   AXIS I:  Major Depression, single episode and Alcohol dependence AXIS II:  Deferred AXIS III:   Past Medical History  Diagnosis Date   AXIS IV:  occupational problems and other psychosocial or environmental problems AXIS V:  61-70 mild symptoms  Plan Of Care/Follow-up recommendations:  Activity:  as tolerated Diet:  healthy Tests:  none Other:  Patient to keep his after care appointment  Is patient on multiple antipsychotic therapies at discharge:  No   Has Patient had three or more failed trials of antipsychotic monotherapy by history:  No  Recommended Plan for Multiple Antipsychotic Therapies: N/A  Thedore Mins, MD 09/05/2012, 11:14 AM

## 2012-09-05 NOTE — Progress Notes (Signed)
Patient ID: Alfred Hicks, male   DOB: 1981-01-13, 31 y.o.   MRN: [id number] Patient discharged without incident.  He received all belongings from locker.  Patient received all medication samples, aa list of meetings, discharge instructions.  He will follow up with [facility].  Patient filled out a Biomedical engineer.  He states that he intends to get a sponsor and attend meetings; he is vested in his recovery.  He denies any SI/HI/AVH.  He is appreciative of all the help he received here at [facility].

## 2012-09-05 NOTE — Progress Notes (Signed)
[facility] Case Management Discharge Plan:  Will you be returning to the same living situation after discharge: Yes,  with mother At discharge, do you have transportation home?:Yes,  mother Do you have the ability to pay for your medications:Yes,  mental health  Interagency Information:     Release of information consent forms completed and in the chart;  Patient's signature needed at discharge.  Patient to Follow up at:  Follow-up Information    Follow up with [facility]. (Walk-in between 8am-9am Monday thru Friday)    Contact information:   [address] [place], [place] [address] [phone] (fax)         Patient denies SI/HI:   Yes,  yes    Safety Planning and Suicide Prevention discussed:  Yes,  yes  Barrier to discharge identified:No.  Summary and Recommendations:   Alfred Hicks 09/05/2012, 9:28 AM

## 2012-09-06 NOTE — Discharge Summary (Signed)
Physician Discharge Summary Note  Patient:  Alfred Hicks is an 31 y.o., male MRN:  [id number] DOB:  11-18-1980 Patient phone:  [phone] (home)  Patient address:   [address] [place] [address],   Date of Admission:  08/30/2012 Date of Discharge: 09/05/2012  Reason for Admission: Suicidal Ideation  Discharge Diagnoses: Principal Problem:  *Major depressive disorder, single episode, severe, specified as with psychotic behavior Active Problems:  Alcohol dependence with alcohol-induced mood disorder   Axis Diagnosis:  Discharge Diagnoses:  AXIS I: Major Depression, single episode and Alcohol dependence  AXIS II: Deferred  AXIS III:  Past Medical History   Diagnosis  Date   AXIS IV: occupational problems and other psychosocial or environmental problems  AXIS V: 61-70 mild symptoms   Level of Care:  OP  Hospital Course:  Hospital Course:      The duration of stay was 6 days.      The patient was seen and evaluated by the Treatment team consisting of Psychiatrist, PAC, RN, Case Manager, and Therapist for evaluation and treatment plan with goal of stabilization upon discharge. The patient's physical and mental health problems were identified and treated appropriately.      Multiple modalities of treatment were used including medication, individual and group therapies, unit programming, improved nutrition, physical activity, and family sessions as needed.     The symptoms of depression with suicidal ideation, and anxiety were monitored daily by evaluation by clinical provider.  The patient's mental and emotional status was evaluated by a daily self inventory completed by the patient.      Improvement was demonstrated by declining numbers on the self assessment, improving vital signs, increased cognition, and improvement in mood, sleep, appetite as well as a reduction in physical symptoms.       The patient was evaluated and found to be stable enough for discharge and  was released to per the initial plan of treatment.   Mental Status Exam:  For mental status exam please see mental status exam and  suicide risk assessment completed by attending physician prior to discharge.    Consults:  None  Significant Diagnostic Studies:  None  Discharge Vitals:   Blood pressure 113/76, pulse 93, temperature 97.3 F (36.3 C), temperature source Oral, resp. rate 20, height 5' 7.5" (1.715 m), weight 85.276 kg (188 lb). Lab Results:   No results found for this or any previous visit (from the past 72 hour(s)).  Physical Findings: AIMS: Facial and Oral Movements Muscles of Facial Expression: None, normal Lips and Perioral Area: None, normal Jaw: None, normal Tongue: None, normal,Extremity Movements Upper (arms, wrists, hands, fingers): None, normal Lower (legs, knees, ankles, toes): None, normal, Trunk Movements Neck, shoulders, hips: None, normal, Overall Severity Severity of abnormal movements (highest score from questions above): None, normal Incapacitation due to abnormal movements: None, normal Patient's awareness of abnormal movements (rate only patient's report): No Awareness, Dental Status Current problems with teeth and/or dentures?: No Does patient usually wear dentures?: No  CIWA:  CIWA-Ar Total: 1  COWS:     Mental Status Exam: See Mental Status Examination and Suicide Risk Assessment completed by Attending Physician prior to discharge.  Discharge destination:  Home  Is patient on multiple antipsychotic therapies at discharge:  No   Has Patient had three or more failed trials of antipsychotic monotherapy by history:  No  Recommended Plan for Multiple Antipsychotic Therapies:  Not applicable  Discharge Orders    Future Orders Please Complete By  Expires   Diet - low sodium heart healthy      Increase activity slowly      Discharge instructions      Comments:   Take all of your medications as prescribed.  Be sure to keep ALL follow up  appointments as scheduled. This is to ensure getting your refills on time to avoid any interruption in your medication.  If you find that you can not keep your appointment, call the clinic and reschedule. Be sure to tell the nurse if you will need a refill before your appointment.   Medication List  STOP taking these medications     acetaminophen 500 MG tablet   Commonly known as: TYLENOL      tetrahydrozoline 0.05 % ophthalmic solution      TAKE these medications      Indication    citalopram 20 MG tablet   Commonly known as: CELEXA   Take 1 tablet (20 mg total) by mouth daily.    Indication: Aggressive Behavior, Depression, Social Anxiety Disorder      clonazePAM 0.5 MG tablet   Commonly known as: KLONOPIN   Take 1 tablet (0.5 mg total) by mouth 2 (two) times daily.    Indication: Panic Disorder      gabapentin 400 MG capsule   Commonly known as: NEURONTIN   Take 1 capsule (400 mg total) by mouth 3 (three) times daily.    Indication: Agitation, Alcohol Withdrawal Syndrome      QUEtiapine 200 MG tablet   Commonly known as: SEROQUEL   Take 1 tablet (200 mg total) by mouth at bedtime.    Indication: Depressive Phase of Manic-Depression      QUEtiapine 50 MG tablet   Commonly known as: SEROQUEL   Take 1 tablet (50 mg total) by mouth 3 (three) times daily at 8am, 12n, and 4pm.    Indication: Manic Phase of Manic-Depression      traZODone 100 MG tablet   Commonly known as: DESYREL   Take 1 tablet (100 mg total) by mouth at bedtime.    Indication: Trouble Sleeping       Follow-up Information    Follow up with [facility]. (Walk-in between 8am-9am Monday thru Friday)    Contact information:   [address], [place] [address] [phone] (fax)    Follow-up recommendations:  As noted above.  Comments:  Jream demonstrated symptoms of bipolar disorder and wanted to discuss this with his next provider.  He is strongly encouraged to discontinue the use of  benzodiazepines and did respond to the low doses of Seroquel during the day.  Signed:  Rona Ravens. Duong Haydel PAC 09/06/2012, 9:47 AM

## 2012-09-07 NOTE — Discharge Summary (Signed)
Seen and agreed. Bernardine Langworthy, MD 

## 2012-09-08 NOTE — Progress Notes (Signed)
Patient Discharge Instructions:  After Visit Summary (AVS):   Faxed to:  09/08/12 Psychiatric Admission Assessment Note:   Faxed to:  09/08/12 Suicide Risk Assessment - Discharge Assessment:   Faxed to:  09/08/12 Faxed/Sent to the Next Level Care provider:  09/08/12 Faxed to [facility] @ [phone]  Jerelene Redden, 09/08/2012, 4:01 PM

## 2012-09-14 NOTE — Progress Notes (Signed)
Said's mother presented to [facility] requesting another prescription for his clonazepam as he was only written for 15 days. This was verified by the pharmacy and AVS/Discharge summary and 16 more tablets were called in to [facility] for him to pick up.  Klonopin 0.5mg  1 tablet BID, #16/0refills was called in. Rona Ravens. Tanyika Barros [facility] 09/14/2012 2:19 PM

## 2012-12-13 ENCOUNTER — Emergency Department (HOSPITAL_COMMUNITY): Payer: Self-pay

## 2012-12-13 ENCOUNTER — Emergency Department (HOSPITAL_COMMUNITY)
Admission: EM | Admit: 2012-12-13 | Discharge: 2012-12-13 | Disposition: A | Discharge status: Home / Self Care | Payer: Self-pay | Attending: Emergency Medicine | Admitting: Emergency Medicine

## 2012-12-13 ENCOUNTER — Encounter (HOSPITAL_COMMUNITY): Payer: Self-pay | Admitting: Emergency Medicine

## 2012-12-13 DIAGNOSIS — R0602 Shortness of breath: Secondary | ICD-10-CM | POA: Insufficient documentation

## 2012-12-13 DIAGNOSIS — J189 Pneumonia, unspecified organism: Secondary | ICD-10-CM

## 2012-12-13 DIAGNOSIS — IMO0001 Reserved for inherently not codable concepts without codable children: Secondary | ICD-10-CM | POA: Insufficient documentation

## 2012-12-13 DIAGNOSIS — J029 Acute pharyngitis, unspecified: Secondary | ICD-10-CM | POA: Insufficient documentation

## 2012-12-13 DIAGNOSIS — J159 Unspecified bacterial pneumonia: Secondary | ICD-10-CM | POA: Insufficient documentation

## 2012-12-13 DIAGNOSIS — R509 Fever, unspecified: Secondary | ICD-10-CM | POA: Insufficient documentation

## 2012-12-13 DIAGNOSIS — Z8659 Personal history of other mental and behavioral disorders: Secondary | ICD-10-CM | POA: Insufficient documentation

## 2012-12-13 DIAGNOSIS — R079 Chest pain, unspecified: Secondary | ICD-10-CM | POA: Insufficient documentation

## 2012-12-13 DIAGNOSIS — F17200 Nicotine dependence, unspecified, uncomplicated: Secondary | ICD-10-CM | POA: Insufficient documentation

## 2012-12-13 DIAGNOSIS — R51 Headache: Secondary | ICD-10-CM | POA: Insufficient documentation

## 2012-12-13 DIAGNOSIS — J3489 Other specified disorders of nose and nasal sinuses: Secondary | ICD-10-CM | POA: Insufficient documentation

## 2012-12-13 DIAGNOSIS — R5381 Other malaise: Secondary | ICD-10-CM | POA: Insufficient documentation

## 2012-12-13 MED ORDER — ALBUTEROL SULFATE (5 MG/ML) 0.5% IN NEBU
INHALATION_SOLUTION | RESPIRATORY_TRACT | Status: AC
  Administered 2012-12-13: 5 mg via RESPIRATORY_TRACT
  Filled 2012-12-13: qty 1

## 2012-12-13 MED ORDER — ALBUTEROL SULFATE HFA 108 (90 BASE) MCG/ACT IN AERS
2.0000 | INHALATION_SPRAY | RESPIRATORY_TRACT | Status: DC | PRN
  Administered 2012-12-13: 2 via RESPIRATORY_TRACT
  Filled 2012-12-13: qty 6.7

## 2012-12-13 MED ORDER — AZITHROMYCIN 250 MG PO TABS: 250.0000 mg | ORAL_TABLET | Freq: Every day | ORAL | Status: DC

## 2012-12-13 MED ORDER — ALBUTEROL SULFATE (5 MG/ML) 0.5% IN NEBU
5.0000 mg | INHALATION_SOLUTION | Freq: Once | RESPIRATORY_TRACT | Status: AC
  Administered 2012-12-13: 5 mg via RESPIRATORY_TRACT

## 2012-12-13 MED ORDER — HYDROCODONE-ACETAMINOPHEN 7.5-325 MG/15ML PO SOLN: 15.0000 mL | Freq: Four times a day (QID) | ORAL | Status: DC | PRN

## 2012-12-13 MED ORDER — AZITHROMYCIN 250 MG PO TABS
500.0000 mg | ORAL_TABLET | Freq: Once | ORAL | Status: AC
  Administered 2012-12-13: 500 mg via ORAL
  Filled 2012-12-13: qty 2

## 2012-12-13 NOTE — ED Provider Notes (Signed)
History     CSN: [id number]  Arrival date & time 12/13/12  0904   First MD Initiated Contact with Patient 12/13/12 1057      Chief Complaint  Patient presents with  . Influenza    (Consider location/radiation/quality/duration/timing/severity/associated sxs/prior treatment) Patient is a 32 y.o. male presenting with flu symptoms. The history is provided by the patient.  Influenza Presenting symptoms: cough, fatigue, fever, headache, myalgias, rhinorrhea, shortness of breath and sore throat   Presenting symptoms: no nausea and no vomiting   Severity:  Moderate Associated symptoms: chills   Associated symptoms comment:  He complains of productive cough, myalgias, fever, being tired and chest pain associated with cough for the past 3 days.   Past Medical History  Diagnosis Date  . Anxiety   . Mental disorder   . Depression     Past Surgical History  Procedure Laterality Date  . Tooth extraction  2000    History reviewed. No pertinent family history.  History  Substance Use Topics  . Smoking status: Current Every Day Smoker -- 1.50 packs/day for 15 years    Types: Cigarettes  . Smokeless tobacco: Not on file  . Alcohol Use: 1.8 oz/week    3 Cans of beer per week     Comment: "a lot" but has been detoxing recently and now only 3 beers last 10 days      Review of Systems  Constitutional: Positive for fever, chills and fatigue.  HENT: Positive for sore throat and rhinorrhea.   Respiratory: Positive for cough and shortness of breath.   Cardiovascular: Positive for chest pain.       Chest pain with cough.  Gastrointestinal: Negative.  Negative for nausea, vomiting and abdominal pain.  Musculoskeletal: Positive for myalgias.  Skin: Negative.  Negative for rash.  Neurological: Positive for headaches.    Allergies  Review of patient's allergies indicates no known allergies.  Home Medications   Current Outpatient Rx  Name  Route  Sig  Dispense  Refill  .  acetaminophen (TYLENOL) 500 MG tablet   Oral   Take 500 mg by mouth every 3 (three) hours as needed for fever. Alternating with ibuprofen         . guaiFENesin (MUCINEX) 600 MG 12 hr tablet   Oral   Take 600 mg by mouth 2 (two) times daily.         Marland Kitchen ibuprofen (ADVIL,MOTRIN) 200 MG tablet   Oral   Take 400 mg by mouth every 3 (three) hours as needed for pain or fever. Alternating with Tylenol         . Pseudoephedrine-APAP-DM (DAYQUIL MULTI-SYMPTOM) 60-650-20 MG/30ML LIQD   Oral   Take 10 mLs by mouth 3 (three) times daily as needed (for cold).           BP 135/85  Pulse 70  Temp(Src) 98.7 F (37.1 C) (Oral)  Resp 18  SpO2 95%  Physical Exam  Constitutional: He is oriented to person, place, and time. He appears well-developed and well-nourished.  HENT:  Head: Normocephalic.  Right Ear: External ear normal.  Left Ear: External ear normal.  Nose: Mucosal edema present. Right sinus exhibits frontal sinus tenderness. Left sinus exhibits frontal sinus tenderness.  Mouth/Throat: Oropharynx is clear and moist.  Neck: Normal range of motion. Neck supple.  Cardiovascular: Normal rate and normal heart sounds.   No murmur heard. Pulmonary/Chest: Effort normal. He has no wheezes. He has rales.  Poor inspiratory effort. Diffuse rales in left  base.  Abdominal: Soft. Bowel sounds are normal. He exhibits no distension. There is no tenderness.  Musculoskeletal: Normal range of motion.  Lymphadenopathy:    He has no cervical adenopathy.  Neurological: He is alert and oriented to person, place, and time.  Skin: Skin is warm and dry. No pallor.    ED Course  Procedures (including critical care time)  Labs Reviewed - No data to display Dg Chest 2 View  12/13/2012  *RADIOLOGY REPORT*  Clinical Data: Shortness of breath.  CHEST - 2 VIEW  Comparison: None  Findings: The cardiac silhouette, mediastinal and hilar contours are within normal limits.  The lungs demonstrate patchy left  lung airspace opacities suspicious for pneumonia.  The right lung is relatively clear.  No pleural effusion.  The bony thorax is intact.  IMPRESSION: Patchy left lung infiltrates.  Recommend post-treatment follow-up chest x-ray.   Original Report Authenticated By: Rudie Meyer, M.D.      No diagnosis found. 1. CAP    MDM  PNA on CXR. Patient stable. Drinking fluids in department. Feels better after breathing treatment. Stable for discharge.        Arnoldo Hooker, PA-C 12/13/12 1159

## 2012-12-13 NOTE — ED Notes (Addendum)
Pt c/o flu like sx with fever and cough x 3 days; pt sts pain with cough and body aches; pt sts last took tylenol this am

## 2012-12-13 NOTE — ED Provider Notes (Signed)
Medical screening examination/treatment/procedure(s) were performed by non-physician practitioner and as supervising physician I was immediately available for consultation/collaboration.   Carleene Cooper III, MD 12/13/12 [phone]

## 2013-03-28 ENCOUNTER — Inpatient Hospital Stay (HOSPITAL_COMMUNITY)
Admission: RE | Admit: 2013-03-28 | Discharge: 2013-03-31 | DRG: 885 | Disposition: A | Discharge status: Home / Self Care | Payer: No Typology Code available for payment source | Attending: Psychiatry | Admitting: Psychiatry

## 2013-03-28 ENCOUNTER — Encounter (HOSPITAL_COMMUNITY): Payer: Self-pay | Admitting: *Deleted

## 2013-03-28 DIAGNOSIS — F332 Major depressive disorder, recurrent severe without psychotic features: Principal | ICD-10-CM | POA: Diagnosis present

## 2013-03-28 DIAGNOSIS — Z79899 Other long term (current) drug therapy: Secondary | ICD-10-CM

## 2013-03-28 DIAGNOSIS — R45851 Suicidal ideations: Secondary | ICD-10-CM

## 2013-03-28 DIAGNOSIS — F323 Major depressive disorder, single episode, severe with psychotic features: Secondary | ICD-10-CM

## 2013-03-28 MED ORDER — ALUM & MAG HYDROXIDE-SIMETH 200-200-20 MG/5ML PO SUSP: 30.0000 mL | ORAL | Status: DC | PRN

## 2013-03-28 MED ORDER — QUETIAPINE FUMARATE 50 MG PO TABS
50.0000 mg | ORAL_TABLET | Freq: Three times a day (TID) | ORAL | Status: DC
  Administered 2013-03-29 – 2013-03-31 (×8): 50 mg via ORAL
  Filled 2013-03-28 (×7): qty 1
  Filled 2013-03-28: qty 42
  Filled 2013-03-28: qty 1
  Filled 2013-03-28: qty 42
  Filled 2013-03-28 (×5): qty 1
  Filled 2013-03-28: qty 42

## 2013-03-28 MED ORDER — QUETIAPINE FUMARATE ER 200 MG PO TB24: 200.0000 mg | ORAL_TABLET | Freq: Every day | ORAL | Status: DC

## 2013-03-28 MED ORDER — GABAPENTIN 400 MG PO CAPS
400.0000 mg | ORAL_CAPSULE | Freq: Three times a day (TID) | ORAL | Status: DC
  Administered 2013-03-28 – 2013-03-31 (×9): 400 mg via ORAL
  Filled 2013-03-28 (×4): qty 1
  Filled 2013-03-28: qty 42
  Filled 2013-03-28 (×7): qty 1
  Filled 2013-03-28 (×2): qty 42
  Filled 2013-03-28 (×2): qty 1

## 2013-03-28 MED ORDER — NICOTINE 14 MG/24HR TD PT24
14.0000 mg | MEDICATED_PATCH | Freq: Every day | TRANSDERMAL | Status: DC
  Administered 2013-03-29: 14 mg via TRANSDERMAL
  Filled 2013-03-28 (×6): qty 1

## 2013-03-28 MED ORDER — CLONAZEPAM 0.5 MG PO TABS
0.5000 mg | ORAL_TABLET | Freq: Two times a day (BID) | ORAL | Status: DC | PRN
  Administered 2013-03-29 – 2013-03-31 (×5): 0.5 mg via ORAL
  Filled 2013-03-28 (×5): qty 1

## 2013-03-28 MED ORDER — ACETAMINOPHEN 325 MG PO TABS: 650.0000 mg | ORAL_TABLET | Freq: Four times a day (QID) | ORAL | Status: DC | PRN

## 2013-03-28 MED ORDER — QUETIAPINE FUMARATE ER 50 MG PO TB24
100.0000 mg | ORAL_TABLET | Freq: Every day | ORAL | Status: DC
  Administered 2013-03-28 – 2013-03-30 (×3): 100 mg via ORAL
  Filled 2013-03-28: qty 28
  Filled 2013-03-28 (×5): qty 2

## 2013-03-28 MED ORDER — CITALOPRAM HYDROBROMIDE 10 MG PO TABS
10.0000 mg | ORAL_TABLET | Freq: Every day | ORAL | Status: DC
  Administered 2013-03-29 – 2013-03-30 (×2): 10 mg via ORAL
  Filled 2013-03-28 (×5): qty 1

## 2013-03-28 MED ORDER — CITALOPRAM HYDROBROMIDE 20 MG PO TABS: 20.0000 mg | ORAL_TABLET | Freq: Every day | ORAL | Status: DC

## 2013-03-28 MED ORDER — MAGNESIUM HYDROXIDE 400 MG/5ML PO SUSP: 30.0000 mL | Freq: Every day | ORAL | Status: DC | PRN

## 2013-03-28 NOTE — Tx Team (Signed)
Initial Interdisciplinary Treatment Plan  PATIENT STRENGTHS: (choose at least two) Ability for insight Average or above average intelligence Capable of independent living Communication skills General fund of knowledge Motivation for treatment/growth Physical Health Supportive family/friends  PATIENT STRESSORS: Medication change or noncompliance   PROBLEM LIST: Problem List/Patient Goals Date to be addressed Date deferred Reason deferred Estimated date of resolution  depression 03/29/13     anxiety 03/29/13     Visual hallucinations 03/29/13     Panic attacks 03/29/13                                    DISCHARGE CRITERIA:  Improved stabilization in mood, thinking, and/or behavior Motivation to continue treatment in a less acute level of care  PRELIMINARY DISCHARGE PLAN: Attend aftercare/continuing care group Participate in family therapy Return to previous living arrangement Return to previous work or school arrangements  PATIENT/FAMIILY INVOLVEMENT: This treatment plan has been presented to and reviewed with the patient, Alfred Hicks.  The patient and family have been given the opportunity to ask questions and make suggestions.  Hoover Browns 03/28/2013, 9:43 PM
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
## 2013-03-29 DIAGNOSIS — F332 Major depressive disorder, recurrent severe without psychotic features: Secondary | ICD-10-CM

## 2013-03-29 LAB — HEPATIC FUNCTION PANEL
ALT: 23 U/L (ref 0–53)
Albumin: 3.6 g/dL (ref 3.5–5.2)
Alkaline Phosphatase: 56 U/L (ref 39–117)
Indirect Bilirubin: 1.2 mg/dL — ABNORMAL HIGH (ref 0.3–0.9)
Total Protein: 6.8 g/dL (ref 6.0–8.3)

## 2013-03-29 LAB — COMPREHENSIVE METABOLIC PANEL
Albumin: 3.5 g/dL (ref 3.5–5.2)
BUN: 14 mg/dL (ref 6–23)
Calcium: 9.1 mg/dL (ref 8.4–10.5)
Chloride: 103 mEq/L (ref 96–112)
Creatinine, Ser: 1.08 mg/dL (ref 0.50–1.35)
Total Bilirubin: 1.5 mg/dL — ABNORMAL HIGH (ref 0.3–1.2)

## 2013-03-29 LAB — LIPID PANEL: HDL: 55 mg/dL (ref >39)

## 2013-03-29 LAB — MAGNESIUM: Magnesium: 2.3 mg/dL (ref 1.5–2.5)

## 2013-03-29 LAB — CBC
HCT: 45.3 % (ref 39.0–52.0)
MCH: 30.7 pg (ref 26.0–34.0)
MCHC: 34 g/dL (ref 30.0–36.0)
MCV: 90.2 fL (ref 78.0–100.0)
RDW: 13 % (ref 11.5–15.5)
WBC: 6.1 10*3/uL (ref 4.0–10.5)

## 2013-03-29 LAB — ETHANOL: Alcohol, Ethyl (B): <11 mg/dL (ref 0–11)

## 2013-03-29 MED ORDER — TRAZODONE HCL 50 MG PO TABS
50.0000 mg | ORAL_TABLET | Freq: Every evening | ORAL | Status: DC | PRN
  Administered 2013-03-29 – 2013-03-30 (×4): 50 mg via ORAL
  Filled 2013-03-29 (×3): qty 1
  Filled 2013-03-29: qty 28
  Filled 2013-03-29: qty 1

## 2013-03-29 NOTE — Progress Notes (Signed)
Pt.  Was very withdrawn this pm.  States he knew he needed to come in and get help before he got as bad a he was on his last admission to [facility].   Pt. Was concerned about getting a sleep med but pt. York Spaniel he has been off his meds for a month. He received seroquel and neurontin and is presently resting quietly.  No complaints of pain or discomfort. Pt. Denies SI and HI at this time.

## 2013-03-29 NOTE — Progress Notes (Signed)
Patient ID: Alfred Hicks, male   DOB: October 03, 1981, 32 y.o.   MRN: [id number] After group pt. Is up, watching TV, on the phone and asking for medicine, but refused group.

## 2013-03-29 NOTE — Progress Notes (Addendum)
D:  Alfred Hicks is quiet and withdrawn today.  He is attending groups, but has to be prompted to go.  He denies SI/HI/AVH today and is interacting appropriately with staff and others. He is rating depression at -100 and hopelessness at 11/10. A:  Medications administered as ordered.  Safety checks q 15 minutes.  Emotional support provided. R:  Safety maintained on unit.
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
## 2013-03-29 NOTE — Progress Notes (Signed)
Adult Psychoeducational Group Note  Date:  03/29/2013 Time:  8:00PM Group Topic/Focus:  Wrap-Up Group:   The focus of this group is to help patients review their daily goal of treatment and discuss progress on daily workbooks.  Participation Level:  Did Not Attend    Additional Comments: Pt. Didn't attend group.   Bing Plume D 03/29/2013, 9:14 PM

## 2013-03-29 NOTE — BHH Group Notes (Signed)
[facility] LCSW Aftercare Discharge Planning Group Note   03/29/2013 9:09 AM  Participation Quality:  Appropriate  Mood/Affect:  Depressed and Flat  Depression Rating:  Did not rate   Anxiety Rating:   Did not rate  Thoughts of Suicide:  No  Will you contract for safety?   NA  Current AVH:  No  Plan for Discharge/Comments:  Patient stated he has not been awake long enough to know how he is doing.  Writer to meet with patient later in the day.  Transportation Means:   Unknown   Supports:  Unknown  Cabrina Shiroma, Joesph July
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
## 2013-03-29 NOTE — Progress Notes (Signed)
Recreation Therapy Notes  Date: 06.04.2014  Time: 3:00pm  Location: [address] Dayroom   Group Topic/Focus: Geophysicist/field seismologist   Participation Level:  Did not attend.  Marykay Lex Pang Robers, LRT/CTRS  Jearl Klinefelter 03/29/2013 4:38 PM

## 2013-03-29 NOTE — Progress Notes (Signed)
Patient ID: Alfred Hicks, male   DOB: August 31, 1981, 32 y.o.   MRN: [id number] D: pt. Lying in bed staring at ceiling, irritable when asked about attending group, "they said they was just taking BP's, I asked about group, y'all just jerking me around" Pt. Report he wasn't going to group. "I'm just waiting for time get my damn night medicine" Pt. Then apologizes I'm sorry don't mean to be upset with you. A: Writer asked pt. If there was anything she could help him clarify, but he denies. Staff will monitor q76min for safety. R: Pt. Is safe on the unit, refused group.
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
## 2013-03-29 NOTE — [facility] Counselor (Signed)
Adult Psychosocial Assessment Update Interdisciplinary Team  Previous [facility] admissions/discharges:  Admissions Discharges  Date:  08/30/12 Date:  09/05/12  Date: Date:  Date: Date:  Date: Date:  Date: Date:   Changes since the last Psychosocial Assessment (including adherence to outpatient mental health and/or substance abuse treatment, situational issues contributing to decompensation and/or relapse). Patient reports admitting to hospital with SI and multiple plans.He advised he has not been on medications. Patient denies alcohol and drug use.             Discharge Plan 1. Will you be returning to the same living situation after discharge?   Yes: No:      If no, what is your plan?    Yes.  Patient reports he lives with family and plans to return to the home at discharge.       2. Would you like a referral for services when you are discharged? Yes:     If yes, for what services?  No:       Yes, patient will need a referral for outpatient services at discharge.  Patient does not want to return to [facility] but may not have an option due to not having insurance.       Summary and Recommendations (to be completed by the evaluator) Alfred Hicks is a 32 year old Caucasian male admitted with Majaor Depression Disorder.  He will benefit from crisis stabilization, evaluation for medication, psycho-education groups for coping skills development, group therapy and case management for discharge planning.                        Signature:  Wynn Banker, 03/29/2013 8:39 AM

## 2013-03-29 NOTE — Progress Notes (Signed)
NUTRITION ASSESSMENT  Pt identified as at risk on the Malnutrition Screen Tool  INTERVENTION: 1. Educated patient on the importance of nutrition and encouraged intake of food and beverages. 2. Discussed weight goals. 3. Supplements: none at this time  NUTRITION DIAGNOSIS: Unintentional weight loss related to sub-optimal intake as evidenced by pt report.   Goal: Pt to meet >/= 90% of their estimated nutrition needs.  Monitor:  PO intake  Assessment:  Patient admitted with depression and anxiety.  Eating well currently but with poor intake prior to admit per patient.  UBW of 200 lbs but does not know when.  Unable to answer questions regarding time.  Patient 88% UBW. 7% weight loss in the last 7 months per chart weight hx.   32 y.o. male  Height: Ht Readings from Last 1 Encounters:  03/28/13 5\' 8"  (1.727 m)    Weight: Wt Readings from Last 1 Encounters:  03/28/13 176 lb (79.833 kg)    Weight Hx: Wt Readings from Last 10 Encounters:  03/28/13 176 lb (79.833 kg)  08/30/12 188 lb (85.276 kg)    BMI:  Body mass index is 26.77 kg/(m^2). Pt meets criteria for overweight based on current BMI.  Estimated Nutritional Needs: Kcal: 25-30 kcal/kg Protein: > 1 gram protein/kg Fluid: 1 ml/kcal  Diet Order: General Pt is also offered choice of unit snacks mid-morning and mid-afternoon.  Pt is eating as desired.   Lab results and medications reviewed.   Oran Rein, RD, LDN Clinical Inpatient Dietitian Pager:  [phone] Weekend and after hours pager:  [phone]

## 2013-03-30 DIAGNOSIS — F332 Major depressive disorder, recurrent severe without psychotic features: Principal | ICD-10-CM

## 2013-03-30 MED ORDER — CITALOPRAM HYDROBROMIDE 20 MG PO TABS
20.0000 mg | ORAL_TABLET | Freq: Every day | ORAL | Status: DC
  Administered 2013-03-31: 20 mg via ORAL
  Filled 2013-03-30: qty 1
  Filled 2013-03-30: qty 14
  Filled 2013-03-30: qty 1

## 2013-03-30 MED ORDER — HYDROXYZINE HCL 25 MG PO TABS
25.0000 mg | ORAL_TABLET | Freq: Four times a day (QID) | ORAL | Status: DC | PRN
  Administered 2013-03-30 – 2013-03-31 (×3): 25 mg via ORAL
  Filled 2013-03-30 (×2): qty 1

## 2013-03-30 NOTE — Progress Notes (Signed)
D:  Alfred Hicks reports that his appetite is good and he energy level is normal.  He is rating depression at 2/10 and hopelessness at 1/10.  He is isolative and does not attend all groups throughout the day.  He complained on anxiety in the afternoon and received vistaril.  He denies SI/HI/AVH at this time. A:  Medications administered as ordered.  Safety checks q 15 minutes.  Emotional support provided. R:  Safety maintained on unit.

## 2013-03-30 NOTE — [facility] Group Notes (Signed)
[facility] LCSW Aftercare Discharge Planning Group Note   03/30/2013 11:18 AM  Participation Quality:  Appropriate  Mood/Affect:  Depressed  Depression Rating:  2  Anxiety Rating:  10  Thoughts of Suicide:  No  Will you contract for safety?   NA  Current AVH:  No  Plan for Discharge/Comments:  Patient reports being feeling better but anxiety levels being extremely high.  He reports having home and transportation but will need a referral for medication management.    Transportation Means: Patient has transportation.   Supports:  Patient has a good support system.   Beyonca Wisz, Joesph July

## 2013-03-30 NOTE — [facility] Group Notes (Signed)
  [facility] LCSW Group Therapy  [facility] 1:15 - 2:30 PM  03/30/2013 8:29 AM  Type of Therapy:  Group Therapy  Participation Level:  Minimal  Participation Quality:  Attentive  Affect:  Depressed, Flat and Irritable  Cognitive:  Appropriate  Insight:  Developing/Improving and Engaged  Engagement in Therapy:  Developing/Improving, Lacking and Limited  Modes of Intervention:  Discussion, Education, Exploration, Problem-Solving, Rapport Building, Support   Summary of Progress/Problems:  Patient listened attentively to speaker from [facility].  Patient advised of interest in attending programming as long as it does not have a spiritual component.   Wynn Banker 03/30/2013, 8:29 AM   Alivia Cimino, Joesph July 03/30/2013, 4:46 PM

## 2013-03-30 NOTE — [facility] Group Notes (Signed)
Adult Psychoeducational Group Note  Date:  03/30/2013 Time:  10:00  Group Topic/Focus: Therapeutic Activity Self Esteem Action Plan:   The focus of this group is to help patients create a plan to continue to build self-esteem after discharge.  Participation Level:  Did Not Attend  Participation Quality:  Did not attend  Affect:  None  Cognitive:  None  Insight: None  Engagement in Group:  None  Modes of Intervention:  Activity  Additional Comments:  Trystin did not attend group.  Caroll Rancher A 03/30/2013, 11:21 AM

## 2013-03-30 NOTE — Progress Notes (Signed)
Patient ID: Alfred Hicks, male   DOB: Aug 03, 1981, 32 y.o.   MRN: [id number] D: Pt. In room reports increased anxiety, did not rate. Pt. Irritable, but did not verbalize any one concern. Pt. Is asking for meds frequently. A: Writer asked pt. To verbalized concerns. Staff will monitor q89mins for safety. Staff encouraged group. R: Pt. Is safe on the unit. Pt. Attended group.

## 2013-03-30 NOTE — Progress Notes (Signed)
[facility] MD Progress Note  03/30/2013 2:46 PM Alfred Hicks  MRN:  [id number] Subjective:  Alfred Hicks reports feeling more anxious as the day has progressed. Received a prn dose of vistaril and is feeling calmer now. Rates anxiety at nine and depression at three. Patient is asking to have his seroquel dosages increased but is able to recognize that he was off his medication for some time prior. He stated "I guess it will take a few days for them to really help me." The patient describes how stressful his job is and how "It's hard for me to focus when I'm off the medicine. There is so much going on at once."  Diagnosis:   Axis I: Major Depression, Recurrent severe Axis II: Deferred Axis III:  Past Medical History  Diagnosis Date  . Anxiety   . Mental disorder   . Depression    Axis IV: occupational problems, other psychosocial or environmental problems and problems with access to health care services Axis V: 51-60 moderate symptoms  ADL's:  Intact  Sleep: Fair  Appetite:  Good  Suicidal Ideation:  Denies Homicidal Ideation:  Denies AEB (as evidenced by):  Psychiatric Specialty Exam: Review of Systems  Constitutional: Negative.  Negative for fever and chills.  HENT: Negative.  Negative for ear pain, congestion, neck pain and tinnitus.   Eyes: Negative.   Respiratory: Negative.   Cardiovascular: Negative.   Gastrointestinal: Negative.  Negative for heartburn, nausea, vomiting and abdominal pain.  Genitourinary: Negative.  Negative for dysuria, urgency and frequency.  Musculoskeletal: Negative.  Negative for myalgias and back pain.  Skin: Negative.  Negative for itching and rash.  Neurological: Negative for dizziness, tingling, tremors, sensory change and headaches.  Endo/Heme/Allergies: Negative for environmental allergies. Does not bruise/bleed easily.  Psychiatric/Behavioral: Positive for depression. Negative for suicidal ideas, hallucinations, memory loss and substance abuse. The  patient is nervous/anxious. The patient does not have insomnia.     Blood pressure 122/83, pulse 92, temperature 97.3 F (36.3 C), temperature source Oral, resp. rate 18, height 5\' 8"  (1.727 m), weight 79.833 kg (176 lb).Body mass index is 26.77 kg/(m^2).  General Appearance: Casual and Fairly Groomed  Patent attorney::  Fair  Speech:  Pressured  Volume:  Normal  Mood:  Anxious and Dysphoric  Affect:  Congruent  Thought Process:  Goal Directed  Orientation:  Full (Time, Place, and Person)  Thought Content:  WDL  Suicidal Thoughts:  No  Homicidal Thoughts:  No  Memory:  Immediate;   Good Recent;   Fair Remote;   Fair  Judgement:  Impaired  Insight:  Lacking  Psychomotor Activity:  Normal  Concentration:  Good  Recall:  Good  Akathisia:  No  Handed:  Right  AIMS (if indicated):     Assets:  Communication Skills Desire for Improvement Leisure Time Physical Health Resilience Social Support  Sleep:  Number of Hours: 6   Current Medications: Current Facility-Administered Medications  Medication Dose Route Frequency Provider Last Rate Last Dose  . acetaminophen (TYLENOL) tablet 650 mg  650 mg Oral Q6H PRN Kerry Hough, PA-C      . alum & mag hydroxide-simeth (MAALOX/MYLANTA) 200-200-20 MG/5ML suspension 30 mL  30 mL Oral Q4H PRN Kerry Hough, PA-C      . [START ON 03/31/2013] citalopram (CELEXA) tablet 20 mg  20 mg Oral Daily Fransisca Kaufmann, NP      . clonazePAM Scarlette Calico) tablet 0.5 mg  0.5 mg Oral BID PRN Kerry Hough, PA-C  0.5 mg at 03/30/13 0919  . gabapentin (NEURONTIN) capsule 400 mg  400 mg Oral TID Kerry Hough, PA-C   400 mg at 03/30/13 1201  . hydrOXYzine (ATARAX/VISTARIL) tablet 25 mg  25 mg Oral Q6H PRN Fransisca Kaufmann, NP   25 mg at 03/30/13 1313  . magnesium hydroxide (MILK OF MAGNESIA) suspension 30 mL  30 mL Oral Daily PRN Kerry Hough, PA-C      . nicotine (NICODERM CQ - dosed in mg/24 hours) patch 14 mg  14 mg Transdermal Q0600 Kerry Hough, PA-C   14 mg  at 03/29/13 0653  . QUEtiapine (SEROQUEL XR) 24 hr tablet 100 mg  100 mg Oral QHS Kerry Hough, PA-C   100 mg at 03/29/13 2212  . QUEtiapine (SEROQUEL) tablet 50 mg  50 mg Oral TID Kerry Hough, PA-C   50 mg at 03/30/13 1201  . traZODone (DESYREL) tablet 50 mg  50 mg Oral QHS PRN,MR X 1 Fransisca Kaufmann, NP   50 mg at 03/29/13 2313    Lab Results:  Results for orders placed during the hospital encounter of 03/28/13 (from the past 48 hour(s))  CBC     Status: None   Collection Time    03/29/13  6:32 AM      Result Value Range   WBC 6.1  4.0 - 10.5 K/uL   RBC 5.02  4.22 - 5.81 MIL/uL   Hemoglobin 15.4  13.0 - 17.0 g/dL   HCT 78.2  95.6 - 21.3 %   MCV 90.2  78.0 - 100.0 fL   MCH 30.7  26.0 - 34.0 pg   MCHC 34.0  30.0 - 36.0 g/dL   RDW 08.6  57.8 - 46.9 %   Platelets 181  150 - 400 K/uL  COMPREHENSIVE METABOLIC PANEL     Status: Abnormal   Collection Time    03/29/13  6:32 AM      Result Value Range   Sodium 139  135 - 145 mEq/L   Potassium 3.9  3.5 - 5.1 mEq/L   Chloride 103  96 - 112 mEq/L   CO2 29  19 - 32 mEq/L   Glucose, Bld 92  70 - 99 mg/dL   BUN 14  6 - 23 mg/dL   Creatinine, Ser 6.29  0.50 - 1.35 mg/dL   Calcium 9.1  8.4 - 52.8 mg/dL   Total Protein 6.8  6.0 - 8.3 g/dL   Albumin 3.5  3.5 - 5.2 g/dL   AST 28  0 - 37 U/L   ALT 22  0 - 53 U/L   Alkaline Phosphatase 57  39 - 117 U/L   Total Bilirubin 1.5 (*) 0.3 - 1.2 mg/dL   GFR calc non Af Amer >90  >90 mL/min   GFR calc Af Amer >90  >90 mL/min   Comment:            The eGFR has been calculated     using the CKD EPI equation.     This calculation has not been     validated in all clinical     situations.     eGFR's persistently     <90 mL/min signify     possible Chronic Kidney Disease.  MAGNESIUM     Status: None   Collection Time    03/29/13  6:32 AM      Result Value Range   Magnesium 2.3  1.5 - 2.5 mg/dL  ETHANOL  Status: None   Collection Time    03/29/13  6:32 AM      Result Value Range    Alcohol, Ethyl (B) <11  0 - 11 mg/dL   Comment:            LOWEST DETECTABLE LIMIT FOR     SERUM ALCOHOL IS 11 mg/dL     FOR MEDICAL PURPOSES ONLY  LIPID PANEL     Status: Abnormal   Collection Time    03/29/13  6:32 AM      Result Value Range   Cholesterol 149  0 - 200 mg/dL   Triglycerides 161 (*) <150 mg/dL   HDL 55  >09 mg/dL   Total CHOL/HDL Ratio 2.7     VLDL 48 (*) 0 - 40 mg/dL   LDL Cholesterol 46  0 - 99 mg/dL   Comment:            Total Cholesterol/HDL:CHD Risk     Coronary Heart Disease Risk Table                         Men   Women      1/2 Average Risk   3.4   3.3      Average Risk       5.0   4.4      2 X Average Risk   9.6   7.1      3 X Average Risk  23.4   11.0                Use the calculated Patient Ratio     above and the CHD Risk Table     to determine the patient's CHD Risk.                ATP III CLASSIFICATION (LDL):      <100     mg/dL   Optimal      604-540  mg/dL   Near or Above                        Optimal      130-159  mg/dL   Borderline      981-191  mg/dL   High      >478     mg/dL   Very High  HEPATIC FUNCTION PANEL     Status: Abnormal   Collection Time    03/29/13  6:32 AM      Result Value Range   Total Protein 6.8  6.0 - 8.3 g/dL   Albumin 3.6  3.5 - 5.2 g/dL   AST 29  0 - 37 U/L   ALT 23  0 - 53 U/L   Alkaline Phosphatase 56  39 - 117 U/L   Total Bilirubin 1.4 (*) 0.3 - 1.2 mg/dL   Bilirubin, Direct 0.2  0.0 - 0.3 mg/dL   Indirect Bilirubin 1.2 (*) 0.3 - 0.9 mg/dL  TSH     Status: Abnormal   Collection Time    03/29/13  6:32 AM      Result Value Range   TSH 4.527 (*) 0.350 - 4.500 uIU/mL    Physical Findings: AIMS: Facial and Oral Movements Muscles of Facial Expression: None, normal Lips and Perioral Area: None, normal Jaw: None, normal Tongue: None, normal,Extremity Movements Upper (arms, wrists, hands, fingers): None, normal Lower (legs, knees, ankles, toes): None, normal, Trunk Movements Neck, shoulders, hips:  None, normal,  Overall Severity Severity of abnormal movements (highest score from questions above): None, normal Incapacitation due to abnormal movements: None, normal Patient's awareness of abnormal movements (rate only patient's report): No Awareness, Dental Status Current problems with teeth and/or dentures?: No Does patient usually wear dentures?: No  CIWA:    COWS:     Treatment Plan Summary: Daily contact with patient to assess and evaluate symptoms and progress in treatment Medication management  Plan: Continue crisis management and stabilization.  Medication management: Celexa increased to 20 mg daily. Continue current regimen. Vistaril 25 mg every six hours as needed for anxiety.  Encouraged patient to attend groups and participate in group counseling sessions and activities.  Discharge plan in progress.  Continue current treatment plan.   Medical Decision Making Problem Points:  Established problem, stable/improving (1) and Review of psycho-social stressors (1) Data Points:  Review of medication regiment & side effects (2)  I certify that inpatient services furnished can reasonably be expected to improve the patient's condition.   Brylen Wagar NP-C 03/30/2013, 2:46 PM

## 2013-03-30 NOTE — Progress Notes (Signed)
Adult Psychoeducational Group Note  Date:  03/30/2013 Time:  9:12 PM  Group Topic/Focus:  Wrap-Up Group:   The focus of this group is to help patients review their daily goal of treatment and discuss progress on daily workbooks.  Participation Level:  Active  Participation Quality:  Attentive  Affect:  Irritable  Cognitive:  Alert and Oriented  Insight: Appropriate  Engagement in Group:  Engaged  Modes of Intervention:  Discussion and Support  Additional Comments:  Pt was very verbal about not being "listened to by the doctors". He stated that the current psychiatrist on the hall is the same one he met at [facility] that "messed up his meds". His goal is to get his meds back on track but he is frustrated by the doctors here.   Humberto Seals Monique 03/30/2013, 9:12 PM

## 2013-03-31 DIAGNOSIS — F1910 Other psychoactive substance abuse, uncomplicated: Secondary | ICD-10-CM

## 2013-03-31 DIAGNOSIS — F1994 Other psychoactive substance use, unspecified with psychoactive substance-induced mood disorder: Secondary | ICD-10-CM

## 2013-03-31 DIAGNOSIS — F332 Major depressive disorder, recurrent severe without psychotic features: Secondary | ICD-10-CM

## 2013-03-31 MED ORDER — GABAPENTIN 400 MG PO CAPS: 400.0000 mg | ORAL_CAPSULE | Freq: Three times a day (TID) | ORAL | Status: DC

## 2013-03-31 MED ORDER — QUETIAPINE FUMARATE 50 MG PO TABS: 50.0000 mg | ORAL_TABLET | Freq: Three times a day (TID) | ORAL | Status: DC

## 2013-03-31 MED ORDER — QUETIAPINE FUMARATE ER 50 MG PO TB24: 100.0000 mg | ORAL_TABLET | Freq: Every day | ORAL | Status: DC

## 2013-03-31 MED ORDER — CITALOPRAM HYDROBROMIDE 20 MG PO TABS: 20.0000 mg | ORAL_TABLET | Freq: Every day | ORAL | Status: DC

## 2013-03-31 MED ORDER — TRAZODONE HCL 50 MG PO TABS: 50.0000 mg | ORAL_TABLET | Freq: Every evening | ORAL | Status: DC | PRN

## 2013-03-31 MED ORDER — CLONAZEPAM 0.5 MG PO TABS: 0.5000 mg | ORAL_TABLET | Freq: Two times a day (BID) | ORAL | Status: DC | PRN

## 2013-03-31 NOTE — [facility] Suicide Risk Assessment (Addendum)
[facility] INPATIENT:  Family/Significant Other Suicide Prevention Education  Suicide Prevention Education:  Education Completed; Sherri Edwards ([place] - married name) Aunt, Harlan Stains of [facility])has been identified by the patient as the family member/significant other with whom the patient will be residing, and identified as the person(s) who will aid the patient in the event of a mental health crisis (suicidal ideations/suicide attempt).  With written consent from the patient, the family member/significant other has been provided the following suicide prevention education, prior to the and/or following the discharge of the patient.  The suicide prevention education provided includes the following:  Suicide risk factors  Suicide prevention and interventions  National Suicide Hotline telephone number  [facility] assessment telephone number  [facility] Emergency Assistance 911  [facility] and/or Residential Mobile Crisis Unit telephone number  Request made of family/significant other to:  Remove weapons (e.g., guns, rifles, knives), all items previously/currently identified as safety concern.  Aunt advised patient does not have access to guns.  Remove drugs/medications (over-the-counter, prescriptions, illicit drugs), all items previously/currently identified as a safety concern.  The family member/significant other verbalizes understanding of the suicide prevention education information provided.  The family member/significant other agrees to remove the items of safety concern listed above.  Wynn Banker 03/31/2013, 2:42 PM
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
## 2013-03-31 NOTE — Plan of Care (Signed)
Problem: Alteration in mood; excessive anxiety as evidenced by: Goal: STG-Patient can identify triggers for anxiety Outcome: Completed/Met Date Met:  03/31/13 Pt reports social anxiety talking in groups

## 2013-03-31 NOTE — Plan of Care (Signed)
Problem: Ineffective individual coping Goal: STG: Patient will remain free from self harm Outcome: Completed/Met Date Met:  03/31/13 Pt denies si and hi

## 2013-03-31 NOTE — Progress Notes (Signed)
Pt d/c from hospital with family member. All items returned. D/C instructions given, prescriptions given and samples given. Pt denies si and hi.

## 2013-03-31 NOTE — [facility] Suicide Risk Assessment (Signed)
Suicide Risk Assessment  Discharge Assessment     Demographic Factors:  Male, Adolescent or young adult, Caucasian, Low socioeconomic status and Unemployed  Mental Status Per Nursing Assessment::   On Admission:  NA  Current Mental Status by Physician: Self-harm thoughts  Loss Factors: Decrease in vocational status and Financial problems/change in socioeconomic status  Historical Factors: Impulsivity  Risk Reduction Factors:   Sense of responsibility to family, Religious beliefs about death, Living with another person, especially a relative, Positive social support and Positive therapeutic relationship  Continued Clinical Symptoms:  Depression:   Comorbid alcohol abuse/dependence Insomnia Recent sense of peace/wellbeing Severe Alcohol/Substance Abuse/Dependencies  Cognitive Features That Contribute To Risk:  Closed-mindedness Polarized thinking    Suicide Risk:  Minimal: No identifiable suicidal ideation.  Patients presenting with no risk factors but with morbid ruminations; may be classified as minimal risk based on the severity of the depressive symptoms  Discharge Diagnoses:   AXIS I:  Major Depression, Recurrent severe, Substance Abuse and Substance Induced Mood Disorder AXIS II:  Deferred AXIS III:   Past Medical History  Diagnosis Date  . Anxiety   . Mental disorder   . Depression    AXIS IV:  economic problems, housing problems, occupational problems, other psychosocial or environmental problems and problems related to social environment AXIS V:  61-70 mild symptoms  Plan Of Care/Follow-up recommendations:  Activity:  As tolerated Diet:  Regular  Is patient on multiple antipsychotic therapies at discharge:  No   Has Patient had three or more failed trials of antipsychotic monotherapy by history:  No  Recommended Plan for Multiple Antipsychotic Therapies: Not applicable  Toba Claudio,JANARDHAHA R. 03/31/2013, 11:17 AM

## 2013-03-31 NOTE — Progress Notes (Signed)
Date: 03/31/2013  Time: 11:00  Group Topic/Focus:  Relapse Prevention Planning: The focus of this group is to define relapse and discuss the need for planning to combat relapse.  Participation Level: Active  Participation Quality: Appropriate and Supportive  Affect: Appropriate  Cognitive: Appropriate  Insight: Appropriate  Engagement in Group: Engaged and Supportive  Modes of Intervention: Discussion, Education and Support  Additional Comments: pt was a little irritated, was excited about going home. Isla Pence M  03/31/2013, 2:57 PM

## 2013-03-31 NOTE — Progress Notes (Signed)
[facility] Adult Case Management Discharge Plan :  Will you be returning to the same living situation after discharge: Yes,  Patient will return to his home. At discharge, do you have transportation home?:Yes,  Patient to arragne for family to transport him home. Do you have the ability to pay for your medications:No.  Patient will be assisted with indigent medications.  Release of information consent forms completed and in the chart;  Patient's signature needed at discharge.  Patient to Follow up at: Follow-up Information   Follow up with Dr. Lolly Mustache- [facility] On 04/27/2013. (You are scheduled with Dr. Lolly Mustache on Thursday, April 27, 2013 at 3:15 PM)    Contact information:   [address] [place],  [place]  [address]  [phone]      Follow up with Elroy Channel, [facility] On 04/06/2013. (Thursday, April 06, 2013 at 1:00 PM)    Contact information:   [address] [place], [place]   [address]   [phone]      Patient denies SI/HI:   Patient no longer endorsing SI/HI or other thoughts of self harm.     Safety Planning and Suicide Prevention discussed: .Reviewed with all patients during discharge planning group  Marks Scalera, Joesph July 03/31/2013, 1:13 PM

## 2013-04-05 NOTE — Progress Notes (Signed)
Patient Discharge Instructions:  After Visit Summary (AVS):   Faxed to:  04/05/13 Discharge Summary Note:   Faxed to:  04/05/13 Psychiatric Admission Assessment Note:   Faxed to:  04/05/13 Suicide Risk Assessment - Discharge Assessment:   Faxed to:  04/05/13 Faxed/Sent to the Next Level Care provider:  04/05/13 Next Level Care Provider Has Access to the EMR, 04/05/13 Faxed to [facility] @ [phone] Records provided to [facility] via [facility]/Epic access.  Jerelene Redden, 04/05/2013, 3:19 PM

## 2013-04-27 ENCOUNTER — Ambulatory Visit (HOSPITAL_COMMUNITY): Payer: Self-pay | Admitting: Psychiatry

## 2013-10-16 ENCOUNTER — Ambulatory Visit: Payer: Self-pay | Admitting: Internal Medicine

## 2013-12-28 ENCOUNTER — Emergency Department (HOSPITAL_COMMUNITY)
Admission: EM | Admit: 2013-12-28 | Discharge: 2013-12-28 | Disposition: A | Discharge status: Home / Self Care | Payer: Self-pay | Attending: Emergency Medicine | Admitting: Emergency Medicine

## 2013-12-28 ENCOUNTER — Encounter (HOSPITAL_COMMUNITY): Payer: Self-pay | Admitting: Emergency Medicine

## 2013-12-28 DIAGNOSIS — R259 Unspecified abnormal involuntary movements: Secondary | ICD-10-CM | POA: Insufficient documentation

## 2013-12-28 DIAGNOSIS — A088 Other specified intestinal infections: Secondary | ICD-10-CM | POA: Insufficient documentation

## 2013-12-28 DIAGNOSIS — F17200 Nicotine dependence, unspecified, uncomplicated: Secondary | ICD-10-CM | POA: Insufficient documentation

## 2013-12-28 DIAGNOSIS — A084 Viral intestinal infection, unspecified: Secondary | ICD-10-CM

## 2013-12-28 DIAGNOSIS — Z8659 Personal history of other mental and behavioral disorders: Secondary | ICD-10-CM | POA: Insufficient documentation

## 2013-12-28 LAB — CBC WITH DIFFERENTIAL/PLATELET
BASOS ABS: 0 10*3/uL (ref 0.0–0.1)
Basophils Relative: 0 % (ref 0–1)
Eosinophils Absolute: 0.1 10*3/uL (ref 0.0–0.7)
Eosinophils Relative: 1 % (ref 0–5)
HCT: 47.7 % (ref 39.0–52.0)
Hemoglobin: 17.2 g/dL — ABNORMAL HIGH (ref 13.0–17.0)
LYMPHS ABS: 3 10*3/uL (ref 0.7–4.0)
LYMPHS PCT: 25 % (ref 12–46)
MCH: 32.8 pg (ref 26.0–34.0)
MCHC: 36.1 g/dL — ABNORMAL HIGH (ref 30.0–36.0)
MCV: 90.9 fL (ref 78.0–100.0)
Monocytes Absolute: 0.7 10*3/uL (ref 0.1–1.0)
Monocytes Relative: 6 % (ref 3–12)
NEUTROS ABS: 8.4 10*3/uL — AB (ref 1.7–7.7)
NEUTROS PCT: 69 % (ref 43–77)
PLATELETS: 209 10*3/uL (ref 150–400)
RBC: 5.25 MIL/uL (ref 4.22–5.81)
RDW: 12.3 % (ref 11.5–15.5)
WBC: 12.2 10*3/uL — AB (ref 4.0–10.5)

## 2013-12-28 LAB — COMPREHENSIVE METABOLIC PANEL
ALK PHOS: 75 U/L (ref 39–117)
ALT: 67 U/L — AB (ref 0–53)
AST: 88 U/L — AB (ref 0–37)
Albumin: 4.1 g/dL (ref 3.5–5.2)
BUN: 13 mg/dL (ref 6–23)
CALCIUM: 9.3 mg/dL (ref 8.4–10.5)
CHLORIDE: 96 meq/L (ref 96–112)
CO2: 25 meq/L (ref 19–32)
Creatinine, Ser: 1.07 mg/dL (ref 0.50–1.35)
GLUCOSE: 106 mg/dL — AB (ref 70–99)
POTASSIUM: 3.9 meq/L (ref 3.7–5.3)
SODIUM: 137 meq/L (ref 137–147)
Total Bilirubin: 1.4 mg/dL — ABNORMAL HIGH (ref 0.3–1.2)
Total Protein: 7.7 g/dL (ref 6.0–8.3)

## 2013-12-28 MED ORDER — METOCLOPRAMIDE HCL 10 MG PO TABS: 10.0000 mg | ORAL_TABLET | Freq: Four times a day (QID) | ORAL | Status: AC | PRN

## 2013-12-28 MED ORDER — METOCLOPRAMIDE HCL 5 MG/ML IJ SOLN
10.0000 mg | Freq: Once | INTRAMUSCULAR | Status: AC
  Administered 2013-12-28: 10 mg via INTRAVENOUS
  Filled 2013-12-28: qty 2

## 2013-12-28 MED ORDER — SODIUM CHLORIDE 0.9 % IV BOLUS (SEPSIS)
1000.0000 mL | Freq: Once | INTRAVENOUS | Status: AC
  Administered 2013-12-28: 1000 mL via INTRAVENOUS

## 2013-12-28 MED ORDER — DIPHENHYDRAMINE HCL 50 MG/ML IJ SOLN
12.5000 mg | Freq: Once | INTRAMUSCULAR | Status: AC
  Administered 2013-12-28: 12.5 mg via INTRAVENOUS
  Filled 2013-12-28: qty 1

## 2013-12-28 NOTE — ED Provider Notes (Signed)
Medical screening examination/treatment/procedure(s) were performed by non-physician practitioner and as supervising physician I was immediately available for consultation/collaboration.   Jamone Garrido, MD 12/28/13 2348 

## 2013-12-28 NOTE — ED Provider Notes (Signed)
CSN: [id number]     Arrival date & time 12/28/13  1818 History   First MD Initiated Contact with Patient 12/28/13 1957     Chief Complaint  Patient presents with  . Emesis     (Consider location/radiation/quality/duration/timing/severity/associated sxs/prior Treatment) Patient is a 33 y.o. male presenting with vomiting. The history is provided by the patient. No language interpreter was used.  Emesis Severity:  Moderate Duration:  3 days Timing:  Constant Associated symptoms: abdominal pain, diarrhea and myalgias   Associated symptoms comment:  Nausea, vomiting and diarrhea for the past 3 days without fever. He complains of generalized abdominal pain that is cramping in nature. No hematemesis or bloody bowel movements.    Past Medical History  Diagnosis Date  . Anxiety   . Mental disorder   . Depression    Past Surgical History  Procedure Laterality Date  . Tooth extraction  2000   No family history on file. History  Substance Use Topics  . Smoking status: Current Every Day Smoker -- 1.50 packs/day for 15 years    Types: Cigarettes  . Smokeless tobacco: Not on file  . Alcohol Use: 0.0 oz/week    0 Cans of beer per week     Comment: none since discharge from [facility] 08/2012    Review of Systems  Constitutional: Negative for fever.  Respiratory:       Recent URI with cough that is predominantly resolved.   Cardiovascular: Negative for chest pain.  Gastrointestinal: Positive for nausea, vomiting, abdominal pain and diarrhea. Negative for blood in stool.  Genitourinary: Positive for decreased urine volume. Negative for dysuria.  Musculoskeletal: Positive for myalgias.  Skin: Negative for rash.  Neurological: Positive for tremors.      Allergies  Review of patient's allergies indicates no known allergies.  Home Medications   Current Outpatient Rx  Name  Route  Sig  Dispense  Refill  . acetaminophen (TYLENOL) 500 MG tablet   Oral   Take 500 mg by mouth every 6  (six) hours as needed (For stress headaches.).         Marland Kitchen loperamide (IMODIUM) 2 MG capsule   Oral   Take 2 mg by mouth as needed for diarrhea or loose stools.          BP 134/95  Pulse 77  Temp(Src) 98.1 F (36.7 C) (Oral)  Resp 19  SpO2 99% Physical Exam  Constitutional: He is oriented to person, place, and time. He appears well-developed and well-nourished.  Neck: Normal range of motion.  Pulmonary/Chest: Effort normal.  Abdominal: Soft. Bowel sounds are normal. He exhibits no distension.  Generalized abdominal tenderness with mild guarding or rebound. No palpable mass.   Musculoskeletal: Normal range of motion.  Neurological: He is alert and oriented to person, place, and time.  Skin: Skin is warm and dry.  Psychiatric: He has a normal mood and affect.    ED Course  Procedures (including critical care time) Labs Review Labs Reviewed  CBC WITH DIFFERENTIAL - Abnormal; Notable for the following:    WBC 12.2 (*)    Hemoglobin 17.2 (*)    MCHC 36.1 (*)    Neutro Abs 8.4 (*)    All other components within normal limits  COMPREHENSIVE METABOLIC PANEL - Abnormal; Notable for the following:    Glucose, Bld 106 (*)    AST 88 (*)    ALT 67 (*)    Total Bilirubin 1.4 (*)    All other components within normal  limits   Imaging Review No results found.   EKG Interpretation None      MDM   Final diagnoses:  None    1. Viral gastroenteritis  Nausea resolved with IV Zofran. Fluids given - he feels improved. Tolerating PO fluids without further vomiting. Stable for discharge.     Arnoldo HookerShari A Halley Kincer, PA-C 12/28/13 2219

## 2013-12-28 NOTE — ED Notes (Signed)
Pt states that he has been nauseated and throwing up for the last several days. Pt states it has been so bad he has had to sleep on the bathroom floor so he is close to a toilet. Pt states he has thrown up more than 10 times today. Pt states that he has been drinking water, but it comes right back up.

## 2013-12-28 NOTE — ED Notes (Signed)
Pt st's he had flu type symptoms 2 weeks ago then started having nausea, vomiting and diarrhea 3 days ago.  Has not been able to keep anything down.  Also st's today he has been having muscle cramps

## 2013-12-28 NOTE — ED Notes (Signed)
Pt unable to void at this time. Pt states he has been unable to void for a long time since he has been dehydrated.

## 2013-12-28 NOTE — Discharge Instructions (Signed)
Viral Gastroenteritis Viral gastroenteritis is also known as stomach flu. This condition affects the stomach and intestinal tract. It can cause sudden diarrhea and vomiting. The illness typically lasts 3 to 8 days. Most people develop an immune response that eventually gets rid of the virus. While this natural response develops, the virus can make you quite ill. CAUSES  Many different viruses can cause gastroenteritis, such as rotavirus or noroviruses. You can catch one of these viruses by consuming contaminated food or water. You may also catch a virus by sharing utensils or other personal items with an infected person or by touching a contaminated surface. SYMPTOMS  The most common symptoms are diarrhea and vomiting. These problems can cause a severe loss of body fluids (dehydration) and a body salt (electrolyte) imbalance. Other symptoms may include:  Fever.  Headache.  Fatigue.  Abdominal pain. DIAGNOSIS  Your caregiver can usually diagnose viral gastroenteritis based on your symptoms and a physical exam. A stool sample may also be taken to test for the presence of viruses or other infections. TREATMENT  This illness typically goes away on its own. Treatments are aimed at rehydration. The most serious cases of viral gastroenteritis involve vomiting so severely that you are not able to keep fluids down. In these cases, fluids must be given through an intravenous line (IV). HOME CARE INSTRUCTIONS   Drink enough fluids to keep your urine clear or pale yellow. Drink small amounts of fluids frequently and increase the amounts as tolerated.  Ask your caregiver for specific rehydration instructions.  Avoid:  Foods high in sugar.  Alcohol.  Carbonated drinks.  Tobacco.  Juice.  Caffeine drinks.  Extremely hot or cold fluids.  Fatty, greasy foods.  Too much intake of anything at one time.  Dairy products until 24 to 48 hours after diarrhea stops.  You may consume probiotics.  Probiotics are active cultures of beneficial bacteria. They may lessen the amount and number of diarrheal stools in adults. Probiotics can be found in yogurt with active cultures and in supplements.  Wash your hands well to avoid spreading the virus.  Only take over-the-counter or prescription medicines for pain, discomfort, or fever as directed by your caregiver. Do not give aspirin to children. Antidiarrheal medicines are not recommended.  Ask your caregiver if you should continue to take your regular prescribed and over-the-counter medicines.  Keep all follow-up appointments as directed by your caregiver. SEEK IMMEDIATE MEDICAL CARE IF:   You are unable to keep fluids down.  You do not urinate at least once every 6 to 8 hours.  You develop shortness of breath.  You notice blood in your stool or vomit. This may look like coffee grounds.  You have abdominal pain that increases or is concentrated in one small area (localized).  You have persistent vomiting or diarrhea.  You have a fever.  The patient is a child younger than 3 months, and he or she has a fever.  The patient is a child older than 3 months, and he or she has a fever and persistent symptoms.  The patient is a child older than 3 months, and he or she has a fever and symptoms suddenly get worse.  The patient is a baby, and he or she has no tears when crying. MAKE SURE YOU:   Understand these instructions.  Will watch your condition.  Will get help right away if you are not doing well or get worse. Document Released: 10/12/2005 Document Revised: 01/04/2012 Document Reviewed: 07/29/2011   Exit[employer] Patient Information 2014 [employer].  

## 2013-12-28 NOTE — ED Notes (Signed)
Pt denies nausea at this moment.
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
## 2018-07-11 DIAGNOSIS — F410 Panic disorder [episodic paroxysmal anxiety] without agoraphobia: Secondary | ICD-10-CM | POA: Insufficient documentation

## 2020-01-02 DIAGNOSIS — F5104 Psychophysiologic insomnia: Secondary | ICD-10-CM | POA: Insufficient documentation

## 2020-01-22 ENCOUNTER — Other Ambulatory Visit: Payer: Self-pay | Admitting: Adult Health

## 2020-01-23 ENCOUNTER — Other Ambulatory Visit: Payer: Self-pay

## 2020-01-23 ENCOUNTER — Encounter: Payer: Self-pay | Admitting: Adult Health

## 2020-01-23 ENCOUNTER — Ambulatory Visit (INDEPENDENT_AMBULATORY_CARE_PROVIDER_SITE_OTHER): Payer: BC Managed Care – PPO | Admitting: Adult Health

## 2020-01-23 VITALS — BP 143/99 | HR 97 | Ht 69.0 in | Wt 209.0 lb

## 2020-01-23 DIAGNOSIS — F411 Generalized anxiety disorder: Secondary | ICD-10-CM | POA: Diagnosis not present

## 2020-01-23 DIAGNOSIS — G4700 Insomnia, unspecified: Secondary | ICD-10-CM

## 2020-01-23 DIAGNOSIS — F4310 Post-traumatic stress disorder, unspecified: Secondary | ICD-10-CM

## 2020-01-23 DIAGNOSIS — F331 Major depressive disorder, recurrent, moderate: Secondary | ICD-10-CM | POA: Diagnosis not present

## 2020-01-23 DIAGNOSIS — F410 Panic disorder [episodic paroxysmal anxiety] without agoraphobia: Secondary | ICD-10-CM | POA: Diagnosis not present

## 2020-01-23 MED ORDER — CLONAZEPAM 0.5 MG PO TABS: 0.5000 mg | ORAL_TABLET | Freq: Two times a day (BID) | ORAL | 2 refills | Status: DC | PRN

## 2020-01-23 NOTE — Progress Notes (Signed)
[facility] MD/PA/NP Initial Note  01/23/2020 11:41 AM MY MADARIAGA  MRN:  [id number]  Chief Complaint:   HPI:   Describes mood today as "ok". Pleasant. Mood symptoms - reports depression, anxiety, and irritability. Feels like he is struggling more with anxiety and panic. Has been on a Benzodiazepine in the past and feels it "works the best for him". Stating "today is good". Has days when he can't get out of bed. Stating "it's week to week". Stating "I never know how I'm going to be". Has missed work in the past. Fish farm manager understanding and works with him. Has tried different things to help himself. Tries to stay away from negative things. Stable interest and motivation. Taking medications as prescribed.  Energy levels stable. Active, does not have a regular exercise routine. Gardening and cutting down trees - "working on property". Works full-time  Enjoys some usual interests and activities. Engaged. Has been with girlfriend for past 6 years. Has 1 son age 93 - sees twice a month - talking with him on the phone. Brother local. Rest of family deceased.  Appetite adequate. Weight gain 10 pounds. Sleeps well most nights. Averages 6 to 7 hours. Focus and concentration stable. Completing tasks. Managing aspects of household. Working 50 to 60 hours a week.  Denies SI or HI. Denies AH or VH.  Previous medication trials: multiple medication trials.  Visit Diagnosis:    ICD-10-CM   1. Generalized anxiety disorder  F41.1 clonazePAM (KLONOPIN) 0.5 MG tablet  2. Major depressive disorder, recurrent episode, moderate (HCC)  F33.1   3. Insomnia, unspecified type  G47.00   4. Panic attacks  F41.0 clonazePAM (KLONOPIN) 0.5 MG tablet  5. PTSD (post-traumatic stress disorder)  F43.10     Past Psychiatric History:  Admitted 5 years ago for depression and anxiety - panic attacks.  Past Medical History:  Past Medical History:  Diagnosis Date  . Anxiety   . Depression   . Mental disorder     Past  Surgical History:  Procedure Laterality Date  . TOOTH EXTRACTION  2000    Family Psychiatric History: Immediate family with mental health issues - various things.   Family History: No family history on file.  Social History:  Social History   Socioeconomic History  . Marital status: Single    Spouse name: Not on file  . Number of children: Not on file  . Years of education: Not on file  . Highest education level: Not on file  Occupational History  . Not on file  Tobacco Use  . Smoking status: Current Every Day Smoker    Packs/day: 1.50    Years: 15.00    Pack years: 22.50    Types: Cigarettes  . Smokeless tobacco: Never Used  Substance and Sexual Activity  . Alcohol use: Yes    Alcohol/week: 0.0 standard drinks    Comment: none since discharge from [facility] 08/2012  . Drug use: No  . Sexual activity: Yes    Birth control/protection: Condom    Comment: GF on birth control  Other Topics Concern  . Not on file  Social History Narrative  . Not on file   Social Determinants of Health   Financial Resource Strain:   . Difficulty of Paying Living Expenses:   Food Insecurity:   . Worried About Charity fundraiser in the Last Year:   . Arboriculturist in the Last Year:   Transportation Needs:   . Film/video editor (Medical):   Marland Kitchen  Lack of Transportation (Non-Medical):   Physical Activity:   . Days of Exercise per Week:   . Minutes of Exercise per Session:   Stress:   . Feeling of Stress :   Social Connections:   . Frequency of Communication with Friends and Family:   . Frequency of Social Gatherings with Friends and Family:   . Attends Religious Services:   . Active Member of Clubs or Organizations:   . Attends Banker Meetings:   Marland Kitchen Marital Status:     Allergies: No Known Allergies  Metabolic Disorder Labs: No results found for: HGBA1C, MPG No results found for: PROLACTIN Lab Results  Component Value Date   CHOL 149 03/29/2013   TRIG 240 (H)  03/29/2013   HDL 55 03/29/2013   CHOLHDL 2.7 03/29/2013   VLDL 48 (H) 03/29/2013   LDLCALC 46 03/29/2013   Lab Results  Component Value Date   TSH 4.527 (H) 03/29/2013    Therapeutic Level Labs: No results found for: LITHIUM No results found for: VALPROATE No components found for:  CBMZ  Current Medications: Current Outpatient Medications  Medication Sig Dispense Refill  . escitalopram (LEXAPRO) 10 MG tablet TAKE 1/2 TABLET BY MOUTH DAILY FOR 6 DAYS THEN TAKE ONE TABLET EVERY DAY    . acetaminophen (TYLENOL) 500 MG tablet Take 500 mg by mouth every 6 (six) hours as needed (For stress headaches.).    Marland Kitchen clonazePAM (KLONOPIN) 0.5 MG tablet Take 1 tablet (0.5 mg total) by mouth 2 (two) times daily as needed for anxiety. 60 tablet 2  . loperamide (IMODIUM) 2 MG capsule Take 2 mg by mouth as needed for diarrhea or loose stools.    . metoCLOPramide (REGLAN) 10 MG tablet Take 1 tablet (10 mg total) by mouth every 6 (six) hours as needed for nausea. 20 tablet 0  . traZODone (DESYREL) 100 MG tablet Take 100 mg by mouth at bedtime.     No current facility-administered medications for this visit.    Medication Side Effects: none  Orders placed this visit:  No orders of the defined types were placed in this encounter.   Psychiatric Specialty Exam:  Review of Systems  Blood pressure (!) 143/99, pulse 97, height 5\' 9"  (1.753 m), weight 209 lb (94.8 kg).Body mass index is 30.86 kg/m.  General Appearance: Casual, Neat and Well Groomed  Eye Contact:  Good  Speech:  Clear and Coherent and Normal Rate  Volume:  Normal  Mood:  Anxious, Depressed and Irritable  Affect:  Appropriate and Congruent  Thought Process:  Coherent and Descriptions of Associations: Intact  Orientation:  Full (Time, Place, and Person)  Thought Content: Logical   Suicidal Thoughts:  No  Homicidal Thoughts:  No  Memory:  WNL  Judgement:  Good  Insight:  Good  Psychomotor Activity:  Normal  Concentration:   Concentration: Good  Recall:  Good  Fund of Knowledge: Good  Language: Good  Assets:  Communication Skills Desire for Improvement Financial Resources/Insurance Housing Intimacy Leisure Time Physical Health Resilience Social Support Talents/Skills Transportation Vocational/Educational  ADL's:  Intact  Cognition: WNL  Prognosis:  Good   Screenings:  AUDIT     Ad[facility] (Discharged[facility]  Alcohol Use Disorder Identification Test Final Score (AUDIT)  5      Receiving Psychotherapy: No   Treatment Plan/Recommendations:   Plan:  PDMP reviewed  1. Lexapro 10mg  daily 2. Trazadone 100mg  daily 3. Add Clonazepam  0.5mg  BID  RTC 4 weeks  Patient advised to contact office with any questions, adverse effects, or acute worsening in signs and symptoms.  Discussed potential benefits, risk, and side effects of benzodiazepines to include potential risk of tolerance and dependence, as well as possible drowsiness.  Advised patient not to drive if experiencing drowsiness and to take lowest possible effective dose to minimize risk of dependence and tolerance.   Dorothyann Gibbs, NP

## 2020-02-20 ENCOUNTER — Ambulatory Visit: Payer: BC Managed Care – PPO | Admitting: Adult Health

## 2020-04-08 ENCOUNTER — Telehealth: Payer: Self-pay | Admitting: Adult Health

## 2020-04-08 NOTE — Telephone Encounter (Signed)
Alfred Hicks called to make appt. Scheduled for 04/11/20 but he needs refill of his Lexapro.  Been out since Friday and is feeling the effects.  Please send to [facility] [address]

## 2020-04-09 ENCOUNTER — Other Ambulatory Visit: Payer: Self-pay | Admitting: Adult Health

## 2020-04-09 DIAGNOSIS — F411 Generalized anxiety disorder: Secondary | ICD-10-CM

## 2020-04-09 MED ORDER — ESCITALOPRAM OXALATE 10 MG PO TABS: ORAL_TABLET | ORAL | 2 refills | Status: DC

## 2020-04-09 NOTE — Telephone Encounter (Signed)
Will send in script

## 2020-04-11 ENCOUNTER — Other Ambulatory Visit: Payer: Self-pay

## 2020-04-11 ENCOUNTER — Ambulatory Visit (INDEPENDENT_AMBULATORY_CARE_PROVIDER_SITE_OTHER): Payer: BC Managed Care – PPO | Admitting: Adult Health

## 2020-04-11 ENCOUNTER — Encounter: Payer: Self-pay | Admitting: Adult Health

## 2020-04-11 DIAGNOSIS — F331 Major depressive disorder, recurrent, moderate: Secondary | ICD-10-CM | POA: Diagnosis not present

## 2020-04-11 DIAGNOSIS — F411 Generalized anxiety disorder: Secondary | ICD-10-CM

## 2020-04-11 DIAGNOSIS — F410 Panic disorder [episodic paroxysmal anxiety] without agoraphobia: Secondary | ICD-10-CM | POA: Diagnosis not present

## 2020-04-11 DIAGNOSIS — G4700 Insomnia, unspecified: Secondary | ICD-10-CM

## 2020-04-11 MED ORDER — CLONAZEPAM 0.5 MG PO TABS: 0.5000 mg | ORAL_TABLET | Freq: Two times a day (BID) | ORAL | 2 refills | Status: DC | PRN

## 2020-04-11 MED ORDER — TRAZODONE HCL 100 MG PO TABS
100.0000 mg | ORAL_TABLET | Freq: Every day | ORAL | 2 refills | Status: DC
Start: 2020-04-11 — End: 2020-07-11

## 2020-04-11 MED ORDER — ESCITALOPRAM OXALATE 10 MG PO TABS: ORAL_TABLET | ORAL | 2 refills | Status: DC

## 2020-04-11 NOTE — Progress Notes (Signed)
Alfred Hicks [id number] 02-Mar-1981 39 y.o.  Subjective:   Patient ID:  Alfred Hicks is a 39 y.o. (DOB Apr 14, 1981) male.  Chief Complaint: No chief complaint on file.   HPI Alfred Hicks presents to the office today for follow-up of   Describes mood today as "ok". Pleasant. Mood symptoms - reports decreased depression, anxiety, and irritability. Panic attacks decreased with Clonazepam. Stating "everything seems to be working pretty good". Ran out of Lexapro for 3 days and started feeling bad - restarted medication and symptoms resolved. No longer wanting to stay in the bed. Has not missed any work. Stable interest and motivation. Taking medications as prescribed.  Energy levels stable. Active, does not have a regular exercise routine.  Enjoys some usual interests and activities. Engaged. Lives with fiance - together 6 years. Has 1 son age 82. Brother local.  Appetite adequate. Weight stable. Sleeps well most nights. Averages 6 to 7 hours - sleeping through the night. Focus and concentration stable. Completing tasks. Managing aspects of household. Works full time - 50 to 60 hours a week.  Denies SI or HI. Denies AH or VH.  Previous medication trials: multiple medication trials.    Review of Systems:  Review of Systems  Musculoskeletal: Negative for gait problem.  Neurological: Negative for tremors.  Psychiatric/Behavioral:       Please refer to HPI    Medications: I have reviewed the patient's current medications.  Current Outpatient Medications  Medication Sig Dispense Refill  . acetaminophen (TYLENOL) 500 MG tablet Take 500 mg by mouth every 6 (six) hours as needed (For stress headaches.).    Marland Kitchen clonazePAM (KLONOPIN) 0.5 MG tablet Take 1 tablet (0.5 mg total) by mouth 2 (two) times daily as needed for anxiety. 60 tablet 2  . escitalopram (LEXAPRO) 10 MG tablet TAKE ONE TABLET EVERY DAY 30 tablet 2  . loperamide (IMODIUM) 2 MG capsule Take 2 mg by mouth as needed for  diarrhea or loose stools.    . metoCLOPramide (REGLAN) 10 MG tablet Take 1 tablet (10 mg total) by mouth every 6 (six) hours as needed for nausea. 20 tablet 0  . traZODone (DESYREL) 100 MG tablet Take 1 tablet (100 mg total) by mouth at bedtime. 30 tablet 2   No current facility-administered medications for this visit.    Medication Side Effects: None  Allergies: No Known Allergies  Past Medical History:  Diagnosis Date  . Anxiety   . Depression   . Mental disorder     No family history on file.  Social History   Socioeconomic History  . Marital status: Single    Spouse name: Not on file  . Number of children: Not on file  . Years of education: Not on file  . Highest education level: Not on file  Occupational History  . Not on file  Tobacco Use  . Smoking status: Current Every Day Smoker    Packs/day: 1.50    Years: 15.00    Pack years: 22.50    Types: Cigarettes  . Smokeless tobacco: Never Used  Substance and Sexual Activity  . Alcohol use: Yes    Alcohol/week: 0.0 standard drinks    Comment: none since discharge from [facility] 08/2012  . Drug use: No  . Sexual activity: Yes    Birth control/protection: Condom    Comment: GF on birth control  Other Topics Concern  . Not on file  Social History Narrative  . Not on file   Social Determinants  of Health   Financial Resource Strain:   . Difficulty of Paying Living Expenses:   Food Insecurity:   . Worried About Programme researcher, broadcasting/film/video in the Last Year:   . Barista in the Last Year:   Transportation Needs:   . Freight forwarder (Medical):   Marland Kitchen Lack of Transportation (Non-Medical):   Physical Activity:   . Days of Exercise per Week:   . Minutes of Exercise per Session:   Stress:   . Feeling of Stress :   Social Connections:   . Frequency of Communication with Friends and Family:   . Frequency of Social Gatherings with Friends and Family:   . Attends Religious Services:   . Active Member of Clubs or  Organizations:   . Attends Banker Meetings:   Marland Kitchen Marital Status:   Intimate Partner Violence:   . Fear of Current or Ex-Partner:   . Emotionally Abused:   Marland Kitchen Physically Abused:   . Sexually Abused:     Past Medical History, Surgical history, Social history, and Family history were reviewed and updated as appropriate.   Please see review of systems for further details on the patient's review from today.   Objective:   Physical Exam:  There were no vitals taken for this visit.  Physical Exam Constitutional:      General: He is not in acute distress. Musculoskeletal:        General: No deformity.  Neurological:     Mental Status: He is alert and oriented to person, place, and time.     Coordination: Coordination normal.  Psychiatric:        Attention and Perception: Attention and perception normal. He does not perceive auditory or visual hallucinations.        Mood and Affect: Mood normal. Mood is not anxious or depressed. Affect is not labile, blunt, angry or inappropriate.        Speech: Speech normal.        Behavior: Behavior normal.        Thought Content: Thought content normal. Thought content is not paranoid or delusional. Thought content does not include homicidal or suicidal ideation. Thought content does not include homicidal or suicidal plan.        Cognition and Memory: Cognition and memory normal.        Judgment: Judgment normal.     Comments: Insight intact     Lab Review:     Component Value Date/Time   NA 137 12/28/2013 1834   K 3.9 12/28/2013 1834   CL 96 12/28/2013 1834   CO2 25 12/28/2013 1834   GLUCOSE 106 (H) 12/28/2013 1834   BUN 13 12/28/2013 1834   CREATININE 1.07 12/28/2013 1834   CALCIUM 9.3 12/28/2013 1834   PROT 7.7 12/28/2013 1834   ALBUMIN 4.1 12/28/2013 1834   AST 88 (H) 12/28/2013 1834   ALT 67 (H) 12/28/2013 1834   ALKPHOS 75 12/28/2013 1834   BILITOT 1.4 (H) 12/28/2013 1834   GFRNONAA >90 12/28/2013 1834   GFRAA >90  12/28/2013 1834       Component Value Date/Time   WBC 12.2 (H) 12/28/2013 1834   RBC 5.25 12/28/2013 1834   HGB 17.2 (H) 12/28/2013 1834   HCT 47.7 12/28/2013 1834   PLT 209 12/28/2013 1834   MCV 90.9 12/28/2013 1834   MCH 32.8 12/28/2013 1834   MCHC 36.1 (H) 12/28/2013 1834   RDW 12.3 12/28/2013 1834   LYMPHSABS 3.0 12/28/2013  1834   MONOABS 0.7 12/28/2013 1834   EOSABS 0.1 12/28/2013 1834   BASOSABS 0.0 12/28/2013 1834    No results found for: POCLITH, LITHIUM   No results found for: PHENYTOIN, PHENOBARB, VALPROATE, CBMZ   .res Assessment: Plan:    Plan:  PDMP reviewed  1. Lexapro 10mg  daily 2. Trazadone 100mg  daily 3. Clonazepam 0.5mg  BID  RTC 3 month  Patient advised to contact office with any questions, adverse effects, or acute worsening in signs and symptoms.  Discussed potential benefits, risk, and side effects of benzodiazepines to include potential risk of tolerance and dependence, as well as possible drowsiness.  Advised patient not to drive if experiencing drowsiness and to take lowest possible effective dose to minimize risk of dependence and tolerance.   Diagnoses and all orders for this visit:  Insomnia, unspecified type -     traZODone (DESYREL) 100 MG tablet; Take 1 tablet (100 mg total) by mouth at bedtime.  Generalized anxiety disorder -     clonazePAM (KLONOPIN) 0.5 MG tablet; Take 1 tablet (0.5 mg total) by mouth 2 (two) times daily as needed for anxiety. -     escitalopram (LEXAPRO) 10 MG tablet; TAKE ONE TABLET EVERY DAY  Panic attacks -     clonazePAM (KLONOPIN) 0.5 MG tablet; Take 1 tablet (0.5 mg total) by mouth 2 (two) times daily as needed for anxiety.  Major depressive disorder, recurrent episode, moderate (Coun[facility])     Please see After Visit Summary for patient specific instructions.  No future appointments.  No orders of the defined types were placed in this encounter.   -------------------------------

## 2020-07-11 ENCOUNTER — Ambulatory Visit (INDEPENDENT_AMBULATORY_CARE_PROVIDER_SITE_OTHER): Payer: BC Managed Care – PPO | Admitting: Adult Health

## 2020-07-11 ENCOUNTER — Other Ambulatory Visit: Payer: Self-pay

## 2020-07-11 ENCOUNTER — Encounter: Payer: Self-pay | Admitting: Adult Health

## 2020-07-11 DIAGNOSIS — F4310 Post-traumatic stress disorder, unspecified: Secondary | ICD-10-CM

## 2020-07-11 DIAGNOSIS — G4700 Insomnia, unspecified: Secondary | ICD-10-CM

## 2020-07-11 DIAGNOSIS — F411 Generalized anxiety disorder: Secondary | ICD-10-CM | POA: Diagnosis not present

## 2020-07-11 DIAGNOSIS — F331 Major depressive disorder, recurrent, moderate: Secondary | ICD-10-CM | POA: Diagnosis not present

## 2020-07-11 DIAGNOSIS — F410 Panic disorder [episodic paroxysmal anxiety] without agoraphobia: Secondary | ICD-10-CM

## 2020-07-11 MED ORDER — TRAZODONE HCL 100 MG PO TABS: 100.0000 mg | ORAL_TABLET | Freq: Every day | ORAL | 2 refills | Status: DC

## 2020-07-11 MED ORDER — ESCITALOPRAM OXALATE 10 MG PO TABS: ORAL_TABLET | ORAL | 2 refills | Status: DC

## 2020-07-11 MED ORDER — CLONAZEPAM 0.5 MG PO TABS: 0.5000 mg | ORAL_TABLET | Freq: Two times a day (BID) | ORAL | 2 refills | Status: DC | PRN

## 2020-07-11 NOTE — Progress Notes (Signed)
Alfred Hicks [id number] 1981-07-31 39 y.o.  Subjective:   Patient ID:  Alfred Hicks is a 39 y.o. (DOB [birth year]) male.  Chief Complaint: No chief complaint on file.   HPI Alfred Hicks presents to the office today for follow-up of MDD, GAD, PTSD, panic attacks, and insomnia.   Describes mood today as "ok". Pleasant. Mood symptoms - denies depression, anxiety, and irritability. Denies panic attacks. Stating "I do great as long as I take my medication". Recent trip to [place] and forgot medication. He and fiance doing well. Stable interest and motivation. Taking medications as prescribed.  Energy levels stable. Active, does not have a regular exercise routine..   Enjoys some usual interests and activities. Engaged. Lives with fiance. Has 1 son age 6. Brother local.  Appetite adequate. Weight stable. Sleeps well most nights. Averages 6 to 8 hours. Focus and concentration stable. Completing tasks. Managing aspects of household. Works full time - 50 to 60 hours a week.  Denies SI or HI. Denies AH or VH.  Previous medication trials: Multiple medication trials.    Review of Systems:  Review of Systems  Musculoskeletal: Negative for gait problem.  Neurological: Negative for tremors.  Psychiatric/Behavioral:       Please refer to HPI    Medications: I have reviewed the patient's current medications.  Current Outpatient Medications  Medication Sig Dispense Refill  . acetaminophen (TYLENOL) 500 MG tablet Take 500 mg by mouth every 6 (six) hours as needed (For stress headaches.).    Marland Kitchen clonazePAM (KLONOPIN) 0.5 MG tablet Take 1 tablet (0.5 mg total) by mouth 2 (two) times daily as needed for anxiety. 60 tablet 2  . escitalopram (LEXAPRO) 10 MG tablet TAKE ONE TABLET EVERY DAY 30 tablet 2  . loperamide (IMODIUM) 2 MG capsule Take 2 mg by mouth as needed for diarrhea or loose stools.    . metoCLOPramide (REGLAN) 10 MG tablet Take 1 tablet (10 mg total) by mouth every  6 (six) hours as needed for nausea. 20 tablet 0  . traZODone (DESYREL) 100 MG tablet Take 1 tablet (100 mg total) by mouth at bedtime. 30 tablet 2   No current facility-administered medications for this visit.    Medication Side Effects: None  Allergies: No Known Allergies  Past Medical History:  Diagnosis Date  . Anxiety   . Depression   . Mental disorder     No family history on file.  Social History   Socioeconomic History  . Marital status: Single    Spouse name: Not on file  . Number of children: Not on file  . Years of education: Not on file  . Highest education level: Not on file  Occupational History  . Not on file  Tobacco Use  . Smoking status: Current Every Day Smoker    Packs/day: 1.50    Years: 15.00    Pack years: 22.50    Types: Cigarettes  . Smokeless tobacco: Never Used  Substance and Sexual Activity  . Alcohol use: Yes    Alcohol/week: 0.0 standard drinks    Comment: none since discharge from [facility] 08/2012  . Drug use: No  . Sexual activity: Yes    Birth control/protection: Condom    Comment: GF on birth control  Other Topics Concern  . Not on file  Social History Narrative  . Not on file   Social Determinants of Health   Financial Resource Strain:   . Difficulty of Paying Living Expenses: Not on file  Food Insecurity:   . Worried About Programme researcher, broadcasting/film/video in the Last Year: Not on file  . Ran Out of Food in the Last Year: Not on file  Transportation Needs:   . Lack of Transportation (Medical): Not on file  . Lack of Transportation (Non-Medical): Not on file  Physical Activity:   . Days of Exercise per Week: Not on file  . Minutes of Exercise per Session: Not on file  Stress:   . Feeling of Stress : Not on file  Social Connections:   . Frequency of Communication with Friends and Family: Not on file  . Frequency of Social Gatherings with Friends and Family: Not on file  . Attends Religious Services: Not on file  . Active Member of  Clubs or Organizations: Not on file  . Attends Banker Meetings: Not on file  . Marital Status: Not on file  Intimate Partner Violence:   . Fear of Current or Ex-Partner: Not on file  . Emotionally Abused: Not on file  . Physically Abused: Not on file  . Sexually Abused: Not on file    Past Medical History, Surgical history, Social history, and Family history were reviewed and updated as appropriate.   Please see review of systems for further details on the patient's review from today.   Objective:   Physical Exam:  There were no vitals taken for this visit.  Physical Exam Constitutional:      General: He is not in acute distress. Musculoskeletal:        General: No deformity.  Neurological:     Mental Status: He is alert and oriented to person, place, and time.     Coordination: Coordination normal.  Psychiatric:        Attention and Perception: Attention and perception normal. He does not perceive auditory or visual hallucinations.        Mood and Affect: Mood normal. Mood is not anxious or depressed. Affect is not labile, blunt, angry or inappropriate.        Speech: Speech normal.        Behavior: Behavior normal.        Thought Content: Thought content normal. Thought content is not paranoid or delusional. Thought content does not include homicidal or suicidal ideation. Thought content does not include homicidal or suicidal plan.        Cognition and Memory: Cognition and memory normal.        Judgment: Judgment normal.     Comments: Insight intact     Lab Review:     Component Value Date/Time   NA 137 12/28/2013 1834   K 3.9 12/28/2013 1834   CL 96 12/28/2013 1834   CO2 25 12/28/2013 1834   GLUCOSE 106 (H) 12/28/2013 1834   BUN 13 12/28/2013 1834   CREATININE 1.07 12/28/2013 1834   CALCIUM 9.3 12/28/2013 1834   PROT 7.7 12/28/2013 1834   ALBUMIN 4.1 12/28/2013 1834   AST 88 (H) 12/28/2013 1834   ALT 67 (H) 12/28/2013 1834   ALKPHOS 75  12/28/2013 1834   BILITOT 1.4 (H) 12/28/2013 1834   GFRNONAA >90 12/28/2013 1834   GFRAA >90 12/28/2013 1834       Component Value Date/Time   WBC 12.2 (H) 12/28/2013 1834   RBC 5.25 12/28/2013 1834   HGB 17.2 (H) 12/28/2013 1834   HCT 47.7 12/28/2013 1834   PLT 209 12/28/2013 1834   MCV 90.9 12/28/2013 1834   MCH 32.8 12/28/2013 1834  MCHC 36.1 (H) 12/28/2013 1834   RDW 12.3 12/28/2013 1834   LYMPHSABS 3.0 12/28/2013 1834   MONOABS 0.7 12/28/2013 1834   EOSABS 0.1 12/28/2013 1834   BASOSABS 0.0 12/28/2013 1834    No results found for: POCLITH, LITHIUM   No results found for: PHENYTOIN, PHENOBARB, VALPROATE, CBMZ   .res Assessment: Plan:    Plan:  PDMP reviewed  1. Lexapro 10mg  daily 2. Trazadone 100mg  daily 3. Clonazepam 0.5mg  BID  RTC 3 months  Patient advised to contact office with any questions, adverse effects, or acute worsening in signs and symptoms.  Discussed potential benefits, risk, and side effects of benzodiazepines to include potential risk of tolerance and dependence, as well as possible drowsiness.  Advised patient not to drive if experiencing drowsiness and to take lowest possible effective dose to minimize risk of dependence and tolerance.    Diagnoses and all orders for this visit:  Generalized anxiety disorder -     clonazePAM (KLONOPIN) 0.5 MG tablet; Take 1 tablet (0.5 mg total) by mouth 2 (two) times daily as needed for anxiety. -     escitalopram (LEXAPRO) 10 MG tablet; TAKE ONE TABLET EVERY DAY  Panic attacks -     clonazePAM (KLONOPIN) 0.5 MG tablet; Take 1 tablet (0.5 mg total) by mouth 2 (two) times daily as needed for anxiety.  Major depressive disorder, recurrent episode, moderate (HCC)  Insomnia, unspecified type -     traZODone (DESYREL) 100 MG tablet; Take 1 tablet (100 mg total) by mouth at bedtime.  PTSD (post-traumatic stress disorder)     Please see After Visit Summary for patient specific instructions.  Future  Appointments  Date Time Provider Department Center  10/10/2020  8:20 AM Leonie Amacher, , NP [facility] None    No orders of the defined types were placed in this encounter.   -------------------------------

## 2020-10-10 ENCOUNTER — Ambulatory Visit: Payer: BC Managed Care – PPO | Admitting: Adult Health

## 2020-10-14 ENCOUNTER — Ambulatory Visit: Payer: BC Managed Care – PPO | Admitting: Adult Health

## 2020-10-31 ENCOUNTER — Telehealth (INDEPENDENT_AMBULATORY_CARE_PROVIDER_SITE_OTHER): Payer: BC Managed Care – PPO | Admitting: Adult Health

## 2020-10-31 ENCOUNTER — Encounter: Payer: Self-pay | Admitting: Adult Health

## 2020-10-31 DIAGNOSIS — F411 Generalized anxiety disorder: Secondary | ICD-10-CM

## 2020-10-31 DIAGNOSIS — G4700 Insomnia, unspecified: Secondary | ICD-10-CM

## 2020-10-31 DIAGNOSIS — F410 Panic disorder [episodic paroxysmal anxiety] without agoraphobia: Secondary | ICD-10-CM

## 2020-10-31 DIAGNOSIS — F331 Major depressive disorder, recurrent, moderate: Secondary | ICD-10-CM | POA: Diagnosis not present

## 2020-10-31 DIAGNOSIS — F4310 Post-traumatic stress disorder, unspecified: Secondary | ICD-10-CM

## 2020-10-31 MED ORDER — TRAZODONE HCL 100 MG PO TABS: 100.0000 mg | ORAL_TABLET | Freq: Every day | ORAL | 2 refills | Status: DC

## 2020-10-31 MED ORDER — CLONAZEPAM 0.5 MG PO TABS: 0.5000 mg | ORAL_TABLET | Freq: Three times a day (TID) | ORAL | 2 refills | Status: DC | PRN

## 2020-10-31 MED ORDER — ESCITALOPRAM OXALATE 10 MG PO TABS: ORAL_TABLET | ORAL | 2 refills | Status: DC

## 2020-10-31 NOTE — Progress Notes (Signed)
Alfred Hicks [id number] 12-Nov-1980 40 y.o.  Virtual Visit via Video Note  I connected with pt @ on 10/31/20 at  8:00 AM EST by a video enabled telemedicine application and verified that I am speaking with the correct person using two identifiers.   I discussed the limitations of evaluation and management by telemedicine and the availability of in person appointments. The patient expressed understanding and agreed to proceed.  I discussed the assessment and treatment plan with the patient. The patient was provided an opportunity to ask questions and all were answered. The patient agreed with the plan and demonstrated an understanding of the instructions.   The patient was advised to call back or seek an in-person evaluation if the symptoms worsen or if the condition fails to improve as anticipated.  I provided 30 minutes of non-face-to-face time during this encounter.  The patient was located at home.  The provider was located at [facility].   Dorothyann Gibbs, NP   Subjective:   Patient ID:  Alfred Hicks is a 40 y.o. (DOB 1981/09/07) male.  Chief Complaint: No chief complaint on file.   HPI Kasten Leveque presents for follow-up of MDD, GAD, PTSD, panic attacks, and insomnia.  Describes mood today as "so-so". Pleasant. Mood symptoms - denies depression and irritability. Increased anxiety. Reports panic attacks. Abnormally higher anxiety for past few months - has been really bad over past 2 weeks. Stating "it can get so bad, I throw up". Has been out of his comfort zone. Recently visited [place] to see friends. Has been sick with Pnuemonia - "feeling better". He and fiance doing well. Stable interest and motivation. Taking medications as prescribed.  Energy levels stable. Active, does not have a regular exercise routine.   Enjoys some usual interests and activities. Engaged. Lives with fiance. Has 1 son - teenager. Brother local.  Appetite adequate.  Weight loss while in [place] - eating healthy. Sleeps well most nights. Averages 6 to 8 hours. Focus and concentration stable. Completing tasks. Managing aspects of household. Works full time - 50 to 60 hours a week.  Denies SI or HI.  Denies AH or VH.  Previous medication trials: Multiple medication trials.  Review of Systems:  Review of Systems  Musculoskeletal: Negative for gait problem.  Neurological: Negative for tremors.  Psychiatric/Behavioral:       Please refer to HPI    Medications: I have reviewed the patient's current medications.  Current Outpatient Medications  Medication Sig Dispense Refill  . acetaminophen (TYLENOL) 500 MG tablet Take 500 mg by mouth every 6 (six) hours as needed (For stress headaches.).    Marland Kitchen clonazePAM (KLONOPIN) 0.5 MG tablet Take 1 tablet (0.5 mg total) by mouth 3 (three) times daily as needed for anxiety. 90 tablet 2  . escitalopram (LEXAPRO) 10 MG tablet TAKE ONE TABLET EVERY DAY 30 tablet 2  . loperamide (IMODIUM) 2 MG capsule Take 2 mg by mouth as needed for diarrhea or loose stools.    . metoCLOPramide (REGLAN) 10 MG tablet Take 1 tablet (10 mg total) by mouth every 6 (six) hours as needed for nausea. 20 tablet 0  . traZODone (DESYREL) 100 MG tablet Take 1 tablet (100 mg total) by mouth at bedtime. 30 tablet 2   No current facility-administered medications for this visit.    Medication Side Effects: None  Allergies: No Known Allergies  Past Medical History:  Diagnosis Date  . Anxiety   . Depression   .  Mental disorder     No family history on file.  Social History   Socioeconomic History  . Marital status: Single    Spouse name: Not on file  . Number of children: Not on file  . Years of education: Not on file  . Highest education level: Not on file  Occupational History  . Not on file  Tobacco Use  . Smoking status: Current Every Day Smoker    Packs/day: 1.50    Years: 15.00    Pack years: 22.50    Types: Cigarettes   . Smokeless tobacco: Never Used  Substance and Sexual Activity  . Alcohol use: Yes    Alcohol/week: 0.0 standard drinks    Comment: none since discharge from [facility]  . Drug use: No  . Sexual activity: Yes    Birth control/protection: Condom    Comment: GF on birth control  Other Topics Concern  . Not on file  Social History Narrative  . Not on file   Social Determinants of Health   Financial Resource Strain: Not on file  Food Insecurity: Not on file  Transportation Needs: Not on file  Physical Activity: Not on file  Stress: Not on file  Social Connections: Not on file  Intimate Partner Violence: Not on file    Past Medical History, Surgical history, Social history, and Family history were reviewed and updated as appropriate.   Please see review of systems for further details on the patient's review from today.   Objective:   Physical Exam:  There were no vitals taken for this visit.  Physical Exam Constitutional:      General: He is not in acute distress. Musculoskeletal:        General: No deformity.  Neurological:     Mental Status: He is alert and oriented to person, place, and time.     Coordination: Coordination normal.  Psychiatric:        Attention and Perception: Attention and perception normal. He does not perceive auditory or visual hallucinations.        Mood and Affect: Mood normal. Mood is not anxious or depressed. Affect is not labile, blunt, angry or inappropriate.        Speech: Speech normal.        Behavior: Behavior normal.        Thought Content: Thought content normal. Thought content is not paranoid or delusional. Thought content does not include homicidal or suicidal ideation. Thought content does not include homicidal or suicidal plan.        Cognition and Memory: Cognition and memory normal.        Judgment: Judgment normal.     Comments: Insight intact     Lab Review:     Component Value Date/Time   NA 137 12/28/2013 1834   K  3.9 12/28/2013 1834   CL 96 12/28/2013 1834   CO2 25 12/28/2013 1834   GLUCOSE 106 (H) 12/28/2013 1834   BUN 13 12/28/2013 1834   CREATININE 1.07 12/28/2013 1834   CALCIUM 9.3 12/28/2013 1834   PROT 7.7 12/28/2013 1834   ALBUMIN 4.1 12/28/2013 1834   AST 88 (H) 12/28/2013 1834   ALT 67 (H) 12/28/2013 1834   ALKPHOS 75 12/28/2013 1834   BILITOT 1.4 (H) 12/28/2013 1834   GFRNONAA >90 12/28/2013 1834   GFRAA >90 12/28/2013 1834       Component Value Date/Time   WBC 12.2 (H) 12/28/2013 1834   RBC 5.25 12/28/2013 1834  HGB 17.2 (H) 12/28/2013 1834   HCT 47.7 12/28/2013 1834   PLT 209 12/28/2013 1834   MCV 90.9 12/28/2013 1834   MCH 32.8 12/28/2013 1834   MCHC 36.1 (H) 12/28/2013 1834   RDW 12.3 12/28/2013 1834   LYMPHSABS 3.0 12/28/2013 1834   MONOABS 0.7 12/28/2013 1834   EOSABS 0.1 12/28/2013 1834   BASOSABS 0.0 12/28/2013 1834    No results found for: POCLITH, LITHIUM   No results found for: PHENYTOIN, PHENOBARB, VALPROATE, CBMZ   .res Assessment: Plan:    Plan:  PDMP reviewed  1. Lexapro 10mg  daily 2. Trazadone 100mg  daily 3. Clonazepam 0.5mg  BID to TID for increased anxiety and panic.  RTC 3 months  Patient advised to contact office with any questions, adverse effects, or acute worsening in signs and symptoms.  Discussed potential benefits, risk, and side effects of benzodiazepines to include potential risk of tolerance and dependence, as well as possible drowsiness.  Advised patient not to drive if experiencing drowsiness and to take lowest possible effective dose to minimize risk of dependence and tolerance.  Diagnoses and all orders for this visit:  Insomnia, unspecified type -     traZODone (DESYREL) 100 MG tablet; Take 1 tablet (100 mg total) by mouth at bedtime.  Generalized anxiety disorder -     clonazePAM (KLONOPIN) 0.5 MG tablet; Take 1 tablet (0.5 mg total) by mouth 3 (three) times daily as needed for anxiety. -     escitalopram (LEXAPRO) 10  MG tablet; TAKE ONE TABLET EVERY DAY  Panic attacks -     clonazePAM (KLONOPIN) 0.5 MG tablet; Take 1 tablet (0.5 mg total) by mouth 3 (three) times daily as needed for anxiety.  Major depressive disorder, recurrent episode, moderate (HCC)  PTSD (post-traumatic stress disorder)     Please see After Visit Summary for patient specific instructions.  No future appointments.  No orders of the defined types were placed in this encounter.     -------------------------------

## 2021-02-26 ENCOUNTER — Ambulatory Visit (INDEPENDENT_AMBULATORY_CARE_PROVIDER_SITE_OTHER): Payer: BC Managed Care – PPO | Admitting: Adult Health

## 2021-02-26 ENCOUNTER — Other Ambulatory Visit: Payer: Self-pay

## 2021-02-26 ENCOUNTER — Encounter: Payer: Self-pay | Admitting: Adult Health

## 2021-02-26 DIAGNOSIS — G4700 Insomnia, unspecified: Secondary | ICD-10-CM | POA: Diagnosis not present

## 2021-02-26 DIAGNOSIS — F331 Major depressive disorder, recurrent, moderate: Secondary | ICD-10-CM

## 2021-02-26 DIAGNOSIS — F411 Generalized anxiety disorder: Secondary | ICD-10-CM | POA: Diagnosis not present

## 2021-02-26 DIAGNOSIS — F410 Panic disorder [episodic paroxysmal anxiety] without agoraphobia: Secondary | ICD-10-CM

## 2021-02-26 DIAGNOSIS — F4310 Post-traumatic stress disorder, unspecified: Secondary | ICD-10-CM

## 2021-02-26 MED ORDER — TRAZODONE HCL 100 MG PO TABS: 100.0000 mg | ORAL_TABLET | Freq: Every day | ORAL | 5 refills | Status: DC

## 2021-02-26 MED ORDER — ESCITALOPRAM OXALATE 10 MG PO TABS: ORAL_TABLET | ORAL | 5 refills | Status: DC

## 2021-02-26 MED ORDER — CLONAZEPAM 0.5 MG PO TABS: 0.5000 mg | ORAL_TABLET | Freq: Three times a day (TID) | ORAL | 2 refills | Status: DC | PRN

## 2021-02-26 NOTE — Progress Notes (Signed)
Alfred Hicks [id number] [birth year] 40 y.o.  Subjective:   Patient ID:  Alfred Hicks is a 40 y.o. (DOB Apr 01, 1981) male.  Chief Complaint: No chief complaint on file.   HPI Alfred Hicks presents to the office today for follow-up of MDD, GAD, PTSD, panic attacks, and insomnia.  Describes mood today as "so-so". Pleasant. Mood symptoms - reports depression, anxiety and irritability. Reports panic attacks. Stating I was doing "ok". Found out his fiance of 7 years was cheating on him. Tried to work on issues with her and has decided to end relationship. Feels like medications are working well. Stable interest and motivation. Taking medications as prescribed.  Energy levels stable. Active, does not have a regular exercise routine.   Enjoys some usual interests and activities. Single. Lives alone with yorkie (2). Has 1 son - teenager. Brother local.  Appetite adequate. Weight loss. Sleeps well most nights. Averages 6 to 8 hours. Focus and concentration stable. Completing tasks. Managing aspects of household. Works full time - 50 to 60 hours a week. 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 Denies SI or HI.  Denies AH or VH.  Previous medication trials: Multiple medication trials.  Review of Systems:  Review of Systems  Musculoskeletal: Negative for gait problem.  Neurological: Negative for tremors.  Psychiatric/Behavioral:       Please refer to HPI    Medications: I have reviewed the patient's current medications.  Current Outpatient Medications  Medication Sig Dispense Refill  . acetaminophen (TYLENOL) 500 MG tablet Take 500 mg by mouth every 6 (six) hours as needed (For stress headaches.).    Marland Kitchen clonazePAM (KLONOPIN) 0.5 MG tablet Take 1 tablet (0.5 mg total) by mouth 3 (three) times daily as needed for anxiety. 90 tablet 2  . escitalopram (LEXAPRO) 10 MG tablet TAKE ONE TABLET EVERY DAY 30 tablet 5  . loperamide (IMODIUM) 2 MG capsule Take 2 mg by mouth as  needed for diarrhea or loose stools.    . metoCLOPramide (REGLAN) 10 MG tablet Take 1 tablet (10 mg total) by mouth every 6 (six) hours as needed for nausea. 20 tablet 0  . traZODone (DESYREL) 100 MG tablet Take 1 tablet (100 mg total) by mouth at bedtime. 30 tablet 5   No current facility-administered medications for this visit.    Medication Side Effects: None  Allergies: No Known Allergies  Past Medical History:  Diagnosis Date  . Anxiety   . Depression   . Mental disorder     Past Medical History, Surgical history, Social history, and Family history were reviewed and updated as appropriate.   Please see review of systems for further details on the patient's review from today.   Objective:   Physical Exam:  There were no vitals taken for this visit.  Physical Exam Constitutional:      General: He is not in acute distress. Musculoskeletal:        General: No deformity.  Neurological:     Mental Status: He is alert and oriented to person, place, and time.     Coordination: Coordination normal.  Psychiatric:        Attention and Perception: Attention and perception normal. He does not perceive auditory or visual hallucinations.        Mood and Affect: Mood normal. Mood is not anxious or depressed. Affect is not labile, blunt, angry or inappropriate.        Speech: Speech normal.        Behavior:  Behavior normal.        Thought Content: Thought content normal. Thought content is not paranoid or delusional. Thought content does not include homicidal or suicidal ideation. Thought content does not include homicidal or suicidal plan.        Cognition and Memory: Cognition and memory normal.        Judgment: Judgment normal.     Comments: Insight intact     Lab Review:     Component Value Date/Time   NA 137 12/28/2013 1834   K 3.9 12/28/2013 1834   CL 96 12/28/2013 1834   CO2 25 12/28/2013 1834   GLUCOSE 106 (H) 12/28/2013 1834   BUN 13 12/28/2013 1834   CREATININE  1.07 12/28/2013 1834   CALCIUM 9.3 12/28/2013 1834   PROT 7.7 12/28/2013 1834   ALBUMIN 4.1 12/28/2013 1834   AST 88 (H) 12/28/2013 1834   ALT 67 (H) 12/28/2013 1834   ALKPHOS 75 12/28/2013 1834   BILITOT 1.4 (H) 12/28/2013 1834   GFRNONAA >90 12/28/2013 1834   GFRAA >90 12/28/2013 1834       Component Value Date/Time   WBC 12.2 (H) 12/28/2013 1834   RBC 5.25 12/28/2013 1834   HGB 17.2 (H) 12/28/2013 1834   HCT 47.7 12/28/2013 1834   PLT 209 12/28/2013 1834   MCV 90.9 12/28/2013 1834   MCH 32.8 12/28/2013 1834   MCHC 36.1 (H) 12/28/2013 1834   RDW 12.3 12/28/2013 1834   LYMPHSABS 3.0 12/28/2013 1834   MONOABS 0.7 12/28/2013 1834   EOSABS 0.1 12/28/2013 1834   BASOSABS 0.0 12/28/2013 1834    No results found for: POCLITH, LITHIUM   No results found for: PHENYTOIN, PHENOBARB, VALPROATE, CBMZ   .res Assessment: Plan:    Plan:  PDMP reviewed  1. Lexapro 10mg  daily 2. Trazadone 100mg  daily 3. Clonazepam 0.5mg  TID for increased anxiety and panic.  RTC 3 months  Patient advised to contact office with any questions, adverse effects, or acute worsening in signs and symptoms.  Discussed potential benefits, risk, and side effects of benzodiazepines to include potential risk of tolerance and dependence, as well as possible drowsiness.  Advised patient not to drive if experiencing drowsiness and to take lowest possible effective dose to minimize risk of dependence and tolerance.   Diagnoses and all orders for this visit:  Major depressive disorder, recurrent episode, moderate (HCC)  Insomnia, unspecified type -     traZODone (DESYREL) 100 MG tablet; Take 1 tablet (100 mg total) by mouth at bedtime.  Generalized anxiety disorder -     clonazePAM (KLONOPIN) 0.5 MG tablet; Take 1 tablet (0.5 mg total) by mouth 3 (three) times daily as needed for anxiety. -     escitalopram (LEXAPRO) 10 MG tablet; TAKE ONE TABLET EVERY DAY  Panic attacks -     clonazePAM (KLONOPIN) 0.5  MG tablet; Take 1 tablet (0.5 mg total) by mouth 3 (three) times daily as needed for anxiety.  PTSD (post-traumatic stress disorder)     Please see After Visit Summary for patient specific instructions.  No future appointments.  No orders of the defined types were placed in this encounter.   -------------------------------

## 2021-05-30 ENCOUNTER — Ambulatory Visit (INDEPENDENT_AMBULATORY_CARE_PROVIDER_SITE_OTHER): Payer: BC Managed Care – PPO | Admitting: Adult Health

## 2021-05-30 ENCOUNTER — Other Ambulatory Visit: Payer: Self-pay

## 2021-05-30 ENCOUNTER — Encounter: Payer: Self-pay | Admitting: Adult Health

## 2021-05-30 DIAGNOSIS — G4700 Insomnia, unspecified: Secondary | ICD-10-CM | POA: Diagnosis not present

## 2021-05-30 DIAGNOSIS — F331 Major depressive disorder, recurrent, moderate: Secondary | ICD-10-CM

## 2021-05-30 DIAGNOSIS — F411 Generalized anxiety disorder: Secondary | ICD-10-CM

## 2021-05-30 DIAGNOSIS — F4310 Post-traumatic stress disorder, unspecified: Secondary | ICD-10-CM

## 2021-05-30 DIAGNOSIS — F410 Panic disorder [episodic paroxysmal anxiety] without agoraphobia: Secondary | ICD-10-CM

## 2021-05-30 MED ORDER — CLONAZEPAM 0.5 MG PO TABS: 0.5000 mg | ORAL_TABLET | Freq: Three times a day (TID) | ORAL | 2 refills | Status: DC | PRN

## 2021-05-30 MED ORDER — BUPROPION HCL ER (XL) 150 MG PO TB24: ORAL_TABLET | ORAL | 2 refills | Status: DC

## 2021-05-30 NOTE — Progress Notes (Signed)
Alfred Hicks [id number] [birth year] 40 y.o.  Subjective:   Patient ID:  Alfred Hicks is a 40 y.o. (DOB [birth year]) male.  Chief Complaint: No chief complaint on file.   HPI Alfred Hicks presents to the office today for follow-up of MDD, GAD, PTSD, panic attacks, and insomnia.  Describes mood today as "so-so". Pleasant. Mood symptoms - reports increased depression, anxiety and irritability. Stating "things have been terrible". Reports panic attacks. Stating "I have feeling more down lately". Feels like medications aren't working as well as they were. Is willing to consider changes to regimen. Varying interest and motivation. Taking medications as prescribed.  Energy levels stable. Active, does not have a regular exercise routine.   Enjoys some usual interests and activities. Single. Lives alone with yorkie (2). Has 1 son - teenager. Brother local.  Appetite adequate. Weight stable. Sleeps well most nights - developed a sleep schedule. Averages 6 to 8 hours. Focus and concentration stable. Completing tasks. Managing aspects of household. Works full time - 50 to 60 hours a week.  Denies SI or HI.  Denies AH or VH.  Previous medication trials: Multiple medication trials.   Review of Systems:  Review of Systems  Musculoskeletal:  Negative for gait problem.  Neurological:  Negative for tremors.  Psychiatric/Behavioral:         Please refer to HPI   Medications: I have reviewed the patient's current medications.  Current Outpatient Medications  Medication Sig Dispense Refill   buPROPion (WELLBUTRIN XL) 150 MG 24 hr tablet Take one tablet every morning for 7 days, then take two tablets every morning. 60 tablet 2   acetaminophen (TYLENOL) 500 MG tablet Take 500 mg by mouth every 6 (six) hours as needed (For stress headaches.).     clonazePAM (KLONOPIN) 0.5 MG tablet Take 1 tablet (0.5 mg total) by mouth 3 (three) times daily as needed for anxiety. 90 tablet 2    escitalopram (LEXAPRO) 10 MG tablet TAKE ONE TABLET EVERY DAY 30 tablet 5   loperamide (IMODIUM) 2 MG capsule Take 2 mg by mouth as needed for diarrhea or loose stools.     metoCLOPramide (REGLAN) 10 MG tablet Take 1 tablet (10 mg total) by mouth every 6 (six) hours as needed for nausea. 20 tablet 0   traZODone (DESYREL) 100 MG tablet Take 1 tablet (100 mg total) by mouth at bedtime. 30 tablet 5   No current facility-administered medications for this visit.    Medication Side Effects: None  Allergies: No Known Allergies  Past Medical History:  Diagnosis Date   Anxiety    Depression    Mental disorder     Past Medical History, Surgical history, Social history, and Family history were reviewed and updated as appropriate.   Please see review of systems for further details on the patient's review from today.   Objective:   Physical Exam:  There were no vitals taken for this visit.  Physical Exam Constitutional:      General: He is not in acute distress. Musculoskeletal:        General: No deformity.  Neurological:     Mental Status: He is alert and oriented to person, place, and time.     Coordination: Coordination normal.  Psychiatric:        Attention and Perception: Attention and perception normal. He does not perceive auditory or visual hallucinations.        Mood and Affect: Mood normal. Mood is not anxious or depressed. Affect is  not labile, blunt, angry or inappropriate.        Speech: Speech normal.        Behavior: Behavior normal.        Thought Content: Thought content normal. Thought content is not paranoid or delusional. Thought content does not include homicidal or suicidal ideation. Thought content does not include homicidal or suicidal plan.        Cognition and Memory: Cognition and memory normal.        Judgment: Judgment normal.     Comments: Insight intact    Lab Review:     Component Value Date/Time   NA 137 12/28/2013 1834   K 3.9 12/28/2013 1834    CL 96 12/28/2013 1834   CO2 25 12/28/2013 1834   GLUCOSE 106 (H) 12/28/2013 1834   BUN 13 12/28/2013 1834   CREATININE 1.07 12/28/2013 1834   CALCIUM 9.3 12/28/2013 1834   PROT 7.7 12/28/2013 1834   ALBUMIN 4.1 12/28/2013 1834   AST 88 (H) 12/28/2013 1834   ALT 67 (H) 12/28/2013 1834   ALKPHOS 75 12/28/2013 1834   BILITOT 1.4 (H) 12/28/2013 1834   GFRNONAA >90 12/28/2013 1834   GFRAA >90 12/28/2013 1834       Component Value Date/Time   WBC 12.2 (H) 12/28/2013 1834   RBC 5.25 12/28/2013 1834   HGB 17.2 (H) 12/28/2013 1834   HCT 47.7 12/28/2013 1834   PLT 209 12/28/2013 1834   MCV 90.9 12/28/2013 1834   MCH 32.8 12/28/2013 1834   MCHC 36.1 (H) 12/28/2013 1834   RDW 12.3 12/28/2013 1834   LYMPHSABS 3.0 12/28/2013 1834   MONOABS 0.7 12/28/2013 1834   EOSABS 0.1 12/28/2013 1834   BASOSABS 0.0 12/28/2013 1834    No results found for: POCLITH, LITHIUM   No results found for: PHENYTOIN, PHENOBARB, VALPROATE, CBMZ   .res Assessment: Plan:     Plan:  PDMP reviewed  1. Lexapro 10mg  daily 2. Trazadone 100mg  daily 3. Clonazepam 0.5mg  TID for increased anxiety and panic. 4. Add Wellbutrin XL 150mg  daily x 7 days, then increase to 300mg  daily. Denies seizure history.  RTC 3 months  Patient advised to contact office with any questions, adverse effects, or acute worsening in signs and symptoms.  Discussed potential benefits, risk, and side effects of benzodiazepines to include potential risk of tolerance and dependence, as well as possible drowsiness.  Advised patient not to drive if experiencing drowsiness and to take lowest possible effective dose to minimize risk of dependence and tolerance.  Diagnoses and all orders for this visit:  Major depressive disorder, recurrent episode, moderate (HCC) -     buPROPion (WELLBUTRIN XL) 150 MG 24 hr tablet; Take one tablet every morning for 7 days, then take two tablets every morning.  Generalized anxiety disorder -      buPROPion (WELLBUTRIN XL) 150 MG 24 hr tablet; Take one tablet every morning for 7 days, then take two tablets every morning. -     clonazePAM (KLONOPIN) 0.5 MG tablet; Take 1 tablet (0.5 mg total) by mouth 3 (three) times daily as needed for anxiety.  Panic attacks -     buPROPion (WELLBUTRIN XL) 150 MG 24 hr tablet; Take one tablet every morning for 7 days, then take two tablets every morning. -     clonazePAM (KLONOPIN) 0.5 MG tablet; Take 1 tablet (0.5 mg total) by mouth 3 (three) times daily as needed for anxiety.  Insomnia, unspecified type  PTSD (post-traumatic stress disorder)  Please see After Visit Summary for patient specific instructions.  Future Appointments  Date Time Provider Department Center  08/29/2021  8:00 AM Alexandrya Chim, Thereasa Solo, NP [facility] None    No orders of the defined types were placed in this encounter.   -------------------------------

## 2021-08-20 ENCOUNTER — Other Ambulatory Visit: Payer: Self-pay | Admitting: Adult Health

## 2021-08-20 DIAGNOSIS — G4700 Insomnia, unspecified: Secondary | ICD-10-CM

## 2021-08-20 DIAGNOSIS — F411 Generalized anxiety disorder: Secondary | ICD-10-CM

## 2021-08-29 ENCOUNTER — Ambulatory Visit: Payer: BC Managed Care – PPO | Admitting: Adult Health

## 2021-08-29 NOTE — Progress Notes (Signed)
Patient no show appointment. ? ?

## 2021-09-01 ENCOUNTER — Other Ambulatory Visit: Payer: Self-pay | Admitting: Adult Health

## 2021-09-01 DIAGNOSIS — G4700 Insomnia, unspecified: Secondary | ICD-10-CM

## 2021-09-01 DIAGNOSIS — F331 Major depressive disorder, recurrent, moderate: Secondary | ICD-10-CM

## 2021-09-01 DIAGNOSIS — F410 Panic disorder [episodic paroxysmal anxiety] without agoraphobia: Secondary | ICD-10-CM

## 2021-09-01 DIAGNOSIS — F411 Generalized anxiety disorder: Secondary | ICD-10-CM

## 2021-09-02 ENCOUNTER — Other Ambulatory Visit: Payer: Self-pay

## 2021-09-02 DIAGNOSIS — F411 Generalized anxiety disorder: Secondary | ICD-10-CM

## 2021-09-02 DIAGNOSIS — F410 Panic disorder [episodic paroxysmal anxiety] without agoraphobia: Secondary | ICD-10-CM

## 2021-09-02 MED ORDER — CLONAZEPAM 0.5 MG PO TABS: 0.5000 mg | ORAL_TABLET | Freq: Three times a day (TID) | ORAL | 2 refills | Status: DC | PRN

## 2021-09-30 ENCOUNTER — Other Ambulatory Visit: Payer: Self-pay | Admitting: Adult Health

## 2021-09-30 DIAGNOSIS — F410 Panic disorder [episodic paroxysmal anxiety] without agoraphobia: Secondary | ICD-10-CM

## 2021-09-30 DIAGNOSIS — F411 Generalized anxiety disorder: Secondary | ICD-10-CM

## 2021-09-30 DIAGNOSIS — F331 Major depressive disorder, recurrent, moderate: Secondary | ICD-10-CM

## 2021-09-30 DIAGNOSIS — G4700 Insomnia, unspecified: Secondary | ICD-10-CM

## 2021-10-03 ENCOUNTER — Ambulatory Visit: Payer: BC Managed Care – PPO | Admitting: Adult Health

## 2021-10-03 ENCOUNTER — Other Ambulatory Visit: Payer: Self-pay

## 2021-10-03 ENCOUNTER — Encounter: Payer: Self-pay | Admitting: Adult Health

## 2021-10-03 DIAGNOSIS — G4700 Insomnia, unspecified: Secondary | ICD-10-CM

## 2021-10-03 DIAGNOSIS — F410 Panic disorder [episodic paroxysmal anxiety] without agoraphobia: Secondary | ICD-10-CM

## 2021-10-03 DIAGNOSIS — F411 Generalized anxiety disorder: Secondary | ICD-10-CM | POA: Diagnosis not present

## 2021-10-03 DIAGNOSIS — F331 Major depressive disorder, recurrent, moderate: Secondary | ICD-10-CM | POA: Diagnosis not present

## 2021-10-03 MED ORDER — CLONAZEPAM 0.5 MG PO TABS: 0.5000 mg | ORAL_TABLET | Freq: Three times a day (TID) | ORAL | 2 refills | Status: DC | PRN

## 2021-10-03 MED ORDER — BUPROPION HCL ER (XL) 150 MG PO TB24
ORAL_TABLET | ORAL | 5 refills | Status: DC
Start: 2021-10-03 — End: 2022-03-05

## 2021-10-03 MED ORDER — TRAZODONE HCL 100 MG PO TABS: 100.0000 mg | ORAL_TABLET | Freq: Every day | ORAL | 5 refills | Status: DC

## 2021-10-03 MED ORDER — ESCITALOPRAM OXALATE 10 MG PO TABS
10.0000 mg | ORAL_TABLET | Freq: Every day | ORAL | 5 refills | Status: DC
Start: 2021-10-03 — End: 2022-03-05

## 2021-10-03 NOTE — Progress Notes (Signed)
Alfred Hicks [id number] 1981/05/15 40 y.o.  Subjective:   Patient ID:  Alfred Hicks is a 40 y.o. (DOB 10/01/1981) male.  Chief Complaint: No chief complaint on file.   HPI Alfred Hicks presents to the office today for follow-up of MDD, GAD, PTSD, panic attacks, and insomnia.  Describes mood today as "not the best". Pleasant. Mood symptoms - reports increased depression, anxiety and irritability. Reports panic attacks. Increased worry and rumination. Financial stressors. Stating "things aren't the best".  Ran out of Wellbutrin a week ago - "it made my mood better". Would like to restart the Wellbutrin and increase the dose. Varying interest and motivation. Taking medications as prescribed.  Energy levels stable. Active, does not have a regular exercise routine.   Enjoys some usual interests and activities. Single. Lives alone with yorkie (2). Has 1 son - teenager. Brother local.  Appetite adequate. Weight stable. Sleeps well most nights. Averages 6 to 8 hours. Focus and concentration stable. Completing tasks. Managing aspects of household. Works full time - 50 to 60 hours a week.  Denies SI or HI.  Denies AH or VH.  Previous medication trials: Multiple medication trials.  Review of Systems:  Review of Systems  Musculoskeletal:  Negative for gait problem.  Neurological:  Negative for tremors.  Psychiatric/Behavioral:         Please refer to HPI   Medications: I have reviewed the patient's current medications.  Current Outpatient Medications  Medication Sig Dispense Refill   acetaminophen (TYLENOL) 500 MG tablet Take 500 mg by mouth every 6 (six) hours as needed (For stress headaches.).     buPROPion (WELLBUTRIN XL) 150 MG 24 hr tablet TAKE 3 TABLETS BY MOUTH EVERY MORNING 90 tablet 5   clonazePAM (KLONOPIN) 0.5 MG tablet Take 1 tablet (0.5 mg total) by mouth 3 (three) times daily as needed for anxiety. 90 tablet 2   escitalopram (LEXAPRO) 10 MG tablet  Take 1 tablet (10 mg total) by mouth daily. 30 tablet 5   loperamide (IMODIUM) 2 MG capsule Take 2 mg by mouth as needed for diarrhea or loose stools.     metoCLOPramide (REGLAN) 10 MG tablet Take 1 tablet (10 mg total) by mouth every 6 (six) hours as needed for nausea. 20 tablet 0   traZODone (DESYREL) 100 MG tablet Take 1 tablet (100 mg total) by mouth at bedtime. 30 tablet 5   No current facility-administered medications for this visit.    Medication Side Effects: None  Allergies: No Known Allergies  Past Medical History:  Diagnosis Date   Anxiety    Depression    Mental disorder     Past Medical History, Surgical history, Social history, and Family history were reviewed and updated as appropriate.   Please see review of systems for further details on the patient's review from today.   Objective:   Physical Exam:  There were no vitals taken for this visit.  Physical Exam Constitutional:      General: He is not in acute distress. Musculoskeletal:        General: No deformity.  Neurological:     Mental Status: He is alert and oriented to person, place, and time.     Coordination: Coordination normal.  Psychiatric:        Attention and Perception: Attention and perception normal. He does not perceive auditory or visual hallucinations.        Mood and Affect: Mood normal. Mood is not anxious or depressed. Affect is not  labile, blunt, angry or inappropriate.        Speech: Speech normal.        Behavior: Behavior normal.        Thought Content: Thought content normal. Thought content is not paranoid or delusional. Thought content does not include homicidal or suicidal ideation. Thought content does not include homicidal or suicidal plan.        Cognition and Memory: Cognition and memory normal.        Judgment: Judgment normal.     Comments: Insight intact    Lab Review:     Component Value Date/Time   NA 137 12/28/2013 1834   K 3.9 12/28/2013 1834   CL 96 12/28/2013  1834   CO2 25 12/28/2013 1834   GLUCOSE 106 (H) 12/28/2013 1834   BUN 13 12/28/2013 1834   CREATININE 1.07 12/28/2013 1834   CALCIUM 9.3 12/28/2013 1834   PROT 7.7 12/28/2013 1834   ALBUMIN 4.1 12/28/2013 1834   AST 88 (H) 12/28/2013 1834   ALT 67 (H) 12/28/2013 1834   ALKPHOS 75 12/28/2013 1834   BILITOT 1.4 (H) 12/28/2013 1834   GFRNONAA >90 12/28/2013 1834   GFRAA >90 12/28/2013 1834       Component Value Date/Time   WBC 12.2 (H) 12/28/2013 1834   RBC 5.25 12/28/2013 1834   HGB 17.2 (H) 12/28/2013 1834   HCT 47.7 12/28/2013 1834   PLT 209 12/28/2013 1834   MCV 90.9 12/28/2013 1834   MCH 32.8 12/28/2013 1834   MCHC 36.1 (H) 12/28/2013 1834   RDW 12.3 12/28/2013 1834   LYMPHSABS 3.0 12/28/2013 1834   MONOABS 0.7 12/28/2013 1834   EOSABS 0.1 12/28/2013 1834   BASOSABS 0.0 12/28/2013 1834    No results found for: POCLITH, LITHIUM   No results found for: PHENYTOIN, PHENOBARB, VALPROATE, CBMZ   .res Assessment: Plan:    Plan:  PDMP reviewed  1. Lexapro 10mg  daily 2. Trazadone 100mg  daily 3. Clonazepam 0.5mg  TID for increased anxiety and panic. 4. Restart Wellbutrin XL 150mg  daily x 7 days, then increase to 300mg  daily x 7 days, then increase to 450mg  daily. Denies seizure history.  RTC 3 months  Patient advised to contact office with any questions, adverse effects, or acute worsening in signs and symptoms.  Discussed potential benefits, risk, and side effects of benzodiazepines to include potential risk of tolerance and dependence, as well as possible drowsiness.  Advised patient not to drive if experiencing drowsiness and to take lowest possible effective dose to minimize risk of dependence and tolerance.  Diagnoses and all orders for this visit:  Generalized anxiety disorder -     buPROPion (WELLBUTRIN XL) 150 MG 24 hr tablet; TAKE 3 TABLETS BY MOUTH EVERY MORNING -     escitalopram (LEXAPRO) 10 MG tablet; Take 1 tablet (10 mg total) by mouth daily. -      clonazePAM (KLONOPIN) 0.5 MG tablet; Take 1 tablet (0.5 mg total) by mouth 3 (three) times daily as needed for anxiety.  Panic attacks -     buPROPion (WELLBUTRIN XL) 150 MG 24 hr tablet; TAKE 3 TABLETS BY MOUTH EVERY MORNING -     clonazePAM (KLONOPIN) 0.5 MG tablet; Take 1 tablet (0.5 mg total) by mouth 3 (three) times daily as needed for anxiety.  Major depressive disorder, recurrent episode, moderate (HCC) -     buPROPion (WELLBUTRIN XL) 150 MG 24 hr tablet; TAKE 3 TABLETS BY MOUTH EVERY MORNING  Insomnia, unspecified type -  traZODone (DESYREL) 100 MG tablet; Take 1 tablet (100 mg total) by mouth at bedtime.    Please see After Visit Summary for patient specific instructions.  No future appointments.  No orders of the defined types were placed in this encounter.   -------------------------------

## 2021-12-04 ENCOUNTER — Ambulatory Visit: Payer: BC Managed Care – PPO | Admitting: Adult Health

## 2021-12-04 ENCOUNTER — Encounter: Payer: Self-pay | Admitting: Adult Health

## 2021-12-04 ENCOUNTER — Other Ambulatory Visit: Payer: Self-pay

## 2021-12-04 DIAGNOSIS — F411 Generalized anxiety disorder: Secondary | ICD-10-CM

## 2021-12-04 DIAGNOSIS — G4700 Insomnia, unspecified: Secondary | ICD-10-CM | POA: Diagnosis not present

## 2021-12-04 DIAGNOSIS — F4310 Post-traumatic stress disorder, unspecified: Secondary | ICD-10-CM

## 2021-12-04 DIAGNOSIS — F331 Major depressive disorder, recurrent, moderate: Secondary | ICD-10-CM

## 2021-12-04 DIAGNOSIS — F410 Panic disorder [episodic paroxysmal anxiety] without agoraphobia: Secondary | ICD-10-CM

## 2021-12-04 MED ORDER — CLONAZEPAM 1 MG PO TABS: 1.0000 mg | ORAL_TABLET | Freq: Three times a day (TID) | ORAL | 2 refills | Status: DC | PRN

## 2021-12-04 NOTE — Progress Notes (Signed)
Zaavan Sipp [id number] 1981-09-08 41 y.o.  Subjective:   Patient ID:  Alfred Hicks is a 41 y.o. (DOB July 13, 1981) male.  Chief Complaint: No chief complaint on file.   HPI Alfred Hicks Hicks to the office today for follow-up of MDD, GAD, PTSD, panic attacks, and insomnia.  Describes mood today as "not the best". Pleasant. Mood symptoms - reports decreased depression with addition of Wellbutrin. Increased worry and rumination. Feels irritable at times. Increased anxiety. Reports panic attacks. Symptoms started and worsened since the last visit. Reports symptoms worsened with 2 break in at his home. Feels like anxiety was already worse before the break ins. Has been missing days of work. Feels like he is shaking internally and externally. Holding hands out with visible shaking. Financial stressors with being out of work. Varying interest and motivation. Taking medications as prescribed.  Energy levels stable. Active, does not have a regular exercise routine.   Enjoys some usual interests and activities. Single. Lives alone with yorkie (2). Has 1 son - teenager. Brother local.  Appetite adequate. Weight stable. Sleeping difficulties - up and down all night. Has not had a full night's sleep in the past few weeks. Focus and concentration stable. Completing tasks. Managing aspects of household. Works full time - 50 to 60 hours a week.  Denies SI or HI.  Denies AH or VH.  Previous medication trials: Multiple medication trials.  Review of Systems:  Review of Systems  Musculoskeletal:  Negative for gait problem.  Neurological:  Negative for tremors.  Psychiatric/Behavioral:         Please refer to HPI   Medications: I have reviewed the patient's current medications.  Current Outpatient Medications  Medication Sig Dispense Refill   acetaminophen (TYLENOL) 500 MG tablet Take 500 mg by mouth every 6 (six) hours as needed (For stress headaches.).     buPROPion  (WELLBUTRIN XL) 150 MG 24 hr tablet TAKE 3 TABLETS BY MOUTH EVERY MORNING 90 tablet 5   clonazePAM (KLONOPIN) 0.5 MG tablet Take 1 tablet (0.5 mg total) by mouth 3 (three) times daily as needed for anxiety. 90 tablet 2   escitalopram (LEXAPRO) 10 MG tablet Take 1 tablet (10 mg total) by mouth daily. 30 tablet 5   loperamide (IMODIUM) 2 MG capsule Take 2 mg by mouth as needed for diarrhea or loose stools.     metoCLOPramide (REGLAN) 10 MG tablet Take 1 tablet (10 mg total) by mouth every 6 (six) hours as needed for nausea. 20 tablet 0   traZODone (DESYREL) 100 MG tablet Take 1 tablet (100 mg total) by mouth at bedtime. 30 tablet 5   No current facility-administered medications for this visit.    Medication Side Effects: None  Allergies: No Known Allergies  Past Medical History:  Diagnosis Date   Anxiety    Depression    Mental disorder     Past Medical History, Surgical history, Social history, and Family history were reviewed and updated as appropriate.   Please see review of systems for further details on the patient's review from today.   Objective:   Physical Exam:  There were no vitals taken for this visit.  Physical Exam Constitutional:      General: He is not in acute distress. Musculoskeletal:        General: No deformity.  Neurological:     Mental Status: He is alert and oriented to person, place, and time.     Coordination: Coordination normal.  Psychiatric:  Attention and Perception: Attention and perception normal. He does not perceive auditory or visual hallucinations.        Mood and Affect: Mood normal. Mood is not anxious or depressed. Affect is not labile, blunt, angry or inappropriate.        Speech: Speech normal.        Behavior: Behavior normal.        Thought Content: Thought content normal. Thought content is not paranoid or delusional. Thought content does not include homicidal or suicidal ideation. Thought content does not include homicidal or  suicidal plan.        Cognition and Memory: Cognition and memory normal.        Judgment: Judgment normal.     Comments: Insight intact    Lab Review:     Component Value Date/Time   NA 137 12/28/2013 1834   K 3.9 12/28/2013 1834   CL 96 12/28/2013 1834   CO2 25 12/28/2013 1834   GLUCOSE 106 (H) 12/28/2013 1834   BUN 13 12/28/2013 1834   CREATININE 1.07 12/28/2013 1834   CALCIUM 9.3 12/28/2013 1834   PROT 7.7 12/28/2013 1834   ALBUMIN 4.1 12/28/2013 1834   AST 88 (H) 12/28/2013 1834   ALT 67 (H) 12/28/2013 1834   ALKPHOS 75 12/28/2013 1834   BILITOT 1.4 (H) 12/28/2013 1834   GFRNONAA >90 12/28/2013 1834   GFRAA >90 12/28/2013 1834       Component Value Date/Time   WBC 12.2 (H) 12/28/2013 1834   RBC 5.25 12/28/2013 1834   HGB 17.2 (H) 12/28/2013 1834   HCT 47.7 12/28/2013 1834   PLT 209 12/28/2013 1834   MCV 90.9 12/28/2013 1834   MCH 32.8 12/28/2013 1834   MCHC 36.1 (H) 12/28/2013 1834   RDW 12.3 12/28/2013 1834   LYMPHSABS 3.0 12/28/2013 1834   MONOABS 0.7 12/28/2013 1834   EOSABS 0.1 12/28/2013 1834   BASOSABS 0.0 12/28/2013 1834    No results found for: POCLITH, LITHIUM   No results found for: PHENYTOIN, PHENOBARB, VALPROATE, CBMZ   .res Assessment: Plan:    Plan:  PDMP reviewed  1. Lexapro 10mg  daily 2. Trazadone 100mg  daily 3. Clonazepam 0.5mg  to 1mg  TID for increased anxiety and panic. 4. Wellbutrin XL 450mg  daily. Denies seizure history.  RTC 3 months  Patient advised to contact office with any questions, adverse effects, or acute worsening in signs and symptoms.  Discussed potential benefits, risk, and side effects of benzodiazepines to include potential risk of tolerance and dependence, as well as possible drowsiness.  Advised patient not to drive if experiencing drowsiness and to take lowest possible effective dose to minimize risk of dependence and tolerance.  There are no diagnoses linked to this encounter.   Please see After Visit  Summary for patient specific instructions.  No future appointments.  No orders of the defined types were placed in this encounter.   -------------------------------

## 2022-01-21 ENCOUNTER — Telehealth: Payer: Self-pay | Admitting: Adult Health

## 2022-01-21 NOTE — Telephone Encounter (Signed)
I will call him.

## 2022-01-21 NOTE — Telephone Encounter (Signed)
Please see message from patient. I have not tried to call him based on the message left. If you want me to call him let me know.  ?

## 2022-01-21 NOTE — Telephone Encounter (Signed)
Pt called at 3:18 pm and asked if Alfred Hicks would call him back today. He said that he called at 11:15 am and left a message. He was told that he could call and talk to her anytime and that he could email her as well. Please give him a call at [phone]. He only wants to talk to Alfred Hicks. ?

## 2022-02-05 ENCOUNTER — Other Ambulatory Visit: Payer: Self-pay | Admitting: Adult Health

## 2022-02-05 DIAGNOSIS — F411 Generalized anxiety disorder: Secondary | ICD-10-CM

## 2022-02-05 DIAGNOSIS — F410 Panic disorder [episodic paroxysmal anxiety] without agoraphobia: Secondary | ICD-10-CM

## 2022-03-05 ENCOUNTER — Ambulatory Visit: Payer: BC Managed Care – PPO | Admitting: Adult Health

## 2022-03-05 ENCOUNTER — Encounter: Payer: Self-pay | Admitting: Adult Health

## 2022-03-05 DIAGNOSIS — F331 Major depressive disorder, recurrent, moderate: Secondary | ICD-10-CM

## 2022-03-05 DIAGNOSIS — F411 Generalized anxiety disorder: Secondary | ICD-10-CM

## 2022-03-05 DIAGNOSIS — F410 Panic disorder [episodic paroxysmal anxiety] without agoraphobia: Secondary | ICD-10-CM

## 2022-03-05 DIAGNOSIS — G4700 Insomnia, unspecified: Secondary | ICD-10-CM | POA: Diagnosis not present

## 2022-03-05 MED ORDER — TRAZODONE HCL 100 MG PO TABS: 100.0000 mg | ORAL_TABLET | Freq: Every day | ORAL | 5 refills | Status: DC

## 2022-03-05 MED ORDER — BUPROPION HCL ER (XL) 150 MG PO TB24: ORAL_TABLET | ORAL | 5 refills | Status: DC

## 2022-03-05 MED ORDER — CLONAZEPAM 1 MG PO TABS: 1.0000 mg | ORAL_TABLET | Freq: Three times a day (TID) | ORAL | 2 refills | Status: DC | PRN

## 2022-03-05 MED ORDER — ESCITALOPRAM OXALATE 10 MG PO TABS: 10.0000 mg | ORAL_TABLET | Freq: Every day | ORAL | 5 refills | Status: DC

## 2022-03-05 NOTE — Progress Notes (Signed)
Alfred Hicks ?[id number] ?June 01, 1981 ?41 y.o. ? ?Subjective:  ? ?Patient ID:  Alfred Hicks is a 41 y.o. (DOB June 27, 1981) male. ? ?Chief Complaint: No chief complaint on file. ? ? ?HPI ?Alfred Hicks presents to the office today for follow-up of MDD, GAD, PTSD, panic attacks, and insomnia. ? ?Describes mood today as "not the best". Pleasant. Mood symptoms - reports decreased depression and anxiety - "it's up and down". Decreased irritability. Decreased worry and rumination. Reports panic attacks - waking up with them and it screws my whole day up. Will be fine for a week or a month and then has one - "no rhyme or reason". Feels like things are leveling out more. Stating "things are better than I was". Missing work some days - when mood unmanageable. ?Financial stressors - "I'm making it". Varying interest and motivation. Taking medications as prescribed.  ?Energy levels stable. Active, does not have a regular exercise routine.   ?Enjoys some usual interests and activities. Single. Lives alone with yorkie (2). Has 1 son - teenager. Brother local.  ?Appetite adequate. Weight stable. ?Sleeping difficulties - amount of sleep varies without taking his sleep medications. Averages - 6 hours.  ?Focus and concentration stable. Completing tasks. Managing aspects of household. Works full time - 50 to 60 hours a week.  ?Denies SI or HI.  ?Denies AH or VH. ? ?Previous medication trials: Multiple medication trials. ? ? ? ?  ? ?Review of Systems:  ?Review of Systems  ?Musculoskeletal:  Negative for gait problem.  ?Neurological:  Negative for tremors.  ?Psychiatric/Behavioral:    ?     Please refer to HPI  ? ?Medications: I have reviewed the patient's current medications. ? ?Current Outpatient Medications  ?Medication Sig Dispense Refill  ? acetaminophen (TYLENOL) 500 MG tablet Take 500 mg by mouth every 6 (six) hours as needed (For stress headaches.).    ? buPROPion (WELLBUTRIN XL) 150 MG 24 hr tablet TAKE 3  TABLETS BY MOUTH EVERY MORNING 90 tablet 5  ? clonazePAM (KLONOPIN) 1 MG tablet Take 1 tablet (1 mg total) by mouth 3 (three) times daily as needed for anxiety. 90 tablet 2  ? escitalopram (LEXAPRO) 10 MG tablet Take 1 tablet (10 mg total) by mouth daily. 30 tablet 5  ? loperamide (IMODIUM) 2 MG capsule Take 2 mg by mouth as needed for diarrhea or loose stools.    ? metoCLOPramide (REGLAN) 10 MG tablet Take 1 tablet (10 mg total) by mouth every 6 (six) hours as needed for nausea. 20 tablet 0  ? traZODone (DESYREL) 100 MG tablet Take 1 tablet (100 mg total) by mouth at bedtime. 30 tablet 5  ? ?No current facility-administered medications for this visit.  ? ? ?Medication Side Effects: None ? ?Allergies: No Known Allergies ? ?Past Medical History:  ?Diagnosis Date  ? Anxiety   ? Depression   ? Mental disorder   ? ? ?Past Medical History, Surgical history, Social history, and Family history were reviewed and updated as appropriate.  ? ?Please see review of systems for further details on the patient's review from today.  ? ?Objective:  ? ?Physical Exam:  ?There were no vitals taken for this visit. ? ?Physical Exam ?Constitutional:   ?   General: He is not in acute distress. ?Musculoskeletal:     ?   General: No deformity.  ?Neurological:  ?   Mental Status: He is alert and oriented to person, place, and time.  ?   Coordination: Coordination  normal.  ?Psychiatric:     ?   Attention and Perception: Attention and perception normal. He does not perceive auditory or visual hallucinations.     ?   Mood and Affect: Mood normal. Mood is not anxious or depressed. Affect is not labile, blunt, angry or inappropriate.     ?   Speech: Speech normal.     ?   Behavior: Behavior normal.     ?   Thought Content: Thought content normal. Thought content is not paranoid or delusional. Thought content does not include homicidal or suicidal ideation. Thought content does not include homicidal or suicidal plan.     ?   Cognition and Memory:  Cognition and memory normal.     ?   Judgment: Judgment normal.  ?   Comments: Insight intact  ? ? ?Lab Review:  ?   ?Component Value Date/Time  ? NA 137 12/28/2013 1834  ? K 3.9 12/28/2013 1834  ? CL 96 12/28/2013 1834  ? CO2 25 12/28/2013 1834  ? GLUCOSE 106 (H) 12/28/2013 1834  ? BUN 13 12/28/2013 1834  ? CREATININE 1.07 12/28/2013 1834  ? CALCIUM 9.3 12/28/2013 1834  ? PROT 7.7 12/28/2013 1834  ? ALBUMIN 4.1 12/28/2013 1834  ? AST 88 (H) 12/28/2013 1834  ? ALT 67 (H) 12/28/2013 1834  ? ALKPHOS 75 12/28/2013 1834  ? BILITOT 1.4 (H) 12/28/2013 1834  ? GFRNONAA >90 12/28/2013 1834  ? GFRAA >90 12/28/2013 1834  ? ? ?   ?Component Value Date/Time  ? WBC 12.2 (H) 12/28/2013 1834  ? RBC 5.25 12/28/2013 1834  ? HGB 17.2 (H) 12/28/2013 1834  ? HCT 47.7 12/28/2013 1834  ? PLT 209 12/28/2013 1834  ? MCV 90.9 12/28/2013 1834  ? MCH 32.8 12/28/2013 1834  ? MCHC 36.1 (H) 12/28/2013 1834  ? RDW 12.3 12/28/2013 1834  ? LYMPHSABS 3.0 12/28/2013 1834  ? MONOABS 0.7 12/28/2013 1834  ? EOSABS 0.1 12/28/2013 1834  ? BASOSABS 0.0 12/28/2013 1834  ? ? ?No results found for: POCLITH, LITHIUM  ? ?No results found for: PHENYTOIN, PHENOBARB, VALPROATE, CBMZ  ? ?.res ?Assessment: Plan:   ? ?Plan: ? ?PDMP reviewed ? ?1. Lexapro 10mg  daily ?2. Trazadone 100mg  daily ?3. Clonazepam 0.5mg  to 1mg  TID for increased anxiety and panic. ?4. Wellbutrin XL 450mg  daily. Denies seizure history. ? ?RTC 3 months ? ?Patient advised to contact office with any questions, adverse effects, or acute worsening in signs and symptoms. ? ?Discussed potential benefits, risk, and side effects of benzodiazepines to include potential risk of tolerance and dependence, as well as possible drowsiness.  Advised patient not to drive if experiencing drowsiness and to take lowest possible effective dose to minimize risk of dependence and tolerance. ? ?Diagnoses and all orders for this visit: ? ?Generalized anxiety disorder ?-     buPROPion (WELLBUTRIN XL) 150 MG 24 hr tablet;  TAKE 3 TABLETS BY MOUTH EVERY MORNING ?-     escitalopram (LEXAPRO) 10 MG tablet; Take 1 tablet (10 mg total) by mouth daily. ?-     clonazePAM (KLONOPIN) 1 MG tablet; Take 1 tablet (1 mg total) by mouth 3 (three) times daily as needed for anxiety. ? ?Panic attacks ?-     buPROPion (WELLBUTRIN XL) 150 MG 24 hr tablet; TAKE 3 TABLETS BY MOUTH EVERY MORNING ?-     clonazePAM (KLONOPIN) 1 MG tablet; Take 1 tablet (1 mg total) by mouth 3 (three) times daily as needed for anxiety. ? ?Major depressive disorder,  recurrent episode, moderate (HCC) ?-     buPROPion (WELLBUTRIN XL) 150 MG 24 hr tablet; TAKE 3 TABLETS BY MOUTH EVERY MORNING ? ?Insomnia, unspecified type ?-     traZODone (DESYREL) 100 MG tablet; Take 1 tablet (100 mg total) by mouth at bedtime. ? ?  ? ?Please see After Visit Summary for patient specific instructions. ? ?No future appointments. ? ?No orders of the defined types were placed in this encounter. ? ? ?------------------------------- ?

## 2022-06-05 ENCOUNTER — Encounter: Payer: Self-pay | Admitting: Adult Health

## 2022-06-05 ENCOUNTER — Ambulatory Visit (INDEPENDENT_AMBULATORY_CARE_PROVIDER_SITE_OTHER): Payer: Commercial Managed Care - PPO | Admitting: Adult Health

## 2022-06-05 DIAGNOSIS — F411 Generalized anxiety disorder: Secondary | ICD-10-CM | POA: Diagnosis not present

## 2022-06-05 DIAGNOSIS — G4700 Insomnia, unspecified: Secondary | ICD-10-CM | POA: Diagnosis not present

## 2022-06-05 DIAGNOSIS — F410 Panic disorder [episodic paroxysmal anxiety] without agoraphobia: Secondary | ICD-10-CM

## 2022-06-05 DIAGNOSIS — F331 Major depressive disorder, recurrent, moderate: Secondary | ICD-10-CM | POA: Diagnosis not present

## 2022-06-05 DIAGNOSIS — F4310 Post-traumatic stress disorder, unspecified: Secondary | ICD-10-CM

## 2022-06-05 MED ORDER — CLONAZEPAM 1 MG PO TABS: 1.0000 mg | ORAL_TABLET | Freq: Three times a day (TID) | ORAL | 2 refills | Status: DC | PRN

## 2022-06-05 NOTE — Progress Notes (Signed)
Alfred Hicks [id number] 1981-10-09 41 y.o.  Subjective:   Patient ID:  Alfred Hicks is a 41 y.o. (DOB [birth year]) male.  Chief Complaint: No chief complaint on file.   HPI Alfred Hicks presents to the office today for follow-up of MDD, GAD, PTSD, panic attacks, and insomnia.  Describes mood today as "not the best". Pleasant. Mood symptoms - reports decreased depression and anxiety - "nothing over the past 2 weeks". Decreased irritability - "less than it was". Decreased worry and rumination - "mostly financial". Reports panic attacks - "not in the past 2 weeks" - "they go up and down". Stating "I'm doing better". Reports situational stressors - recent legal issues. Has missed work due to the stress. Financial stressors. Improved interest and motivation. Taking medications as prescribed.  Energy levels stable. Active, does not have a regular exercise routine.   Enjoys some usual interests and activities. Single. Lives alone with yorkie (age 13). Has 1 son - teenager. Brother local.  Appetite adequate. Weight stable. Sleeping better some nights than others. Averages - 6 hours of broken sleep.  Focus and concentration stable. Completing tasks. Managing aspects of household. Works full time - 50 to 60 hours a week.  Denies SI or HI.  Denies AH or VH. Denies self harm. Denies substance use.  Previous medication trials: Multiple medication trials.   Review of Systems:  Review of Systems  Musculoskeletal:  Negative for gait problem.  Neurological:  Negative for tremors.  Psychiatric/Behavioral:         Please refer to HPI    Medications: I have reviewed the patient's current medications.  Current Outpatient Medications  Medication Sig Dispense Refill   acetaminophen (TYLENOL) 500 MG tablet Take 500 mg by mouth every 6 (six) hours as needed (For stress headaches.).     buPROPion (WELLBUTRIN XL) 150 MG 24 hr tablet TAKE 3 TABLETS BY MOUTH EVERY MORNING 90 tablet 5    clonazePAM (KLONOPIN) 1 MG tablet Take 1 tablet (1 mg total) by mouth 3 (three) times daily as needed for anxiety. 90 tablet 2   escitalopram (LEXAPRO) 10 MG tablet Take 1 tablet (10 mg total) by mouth daily. 30 tablet 5   loperamide (IMODIUM) 2 MG capsule Take 2 mg by mouth as needed for diarrhea or loose stools.     metoCLOPramide (REGLAN) 10 MG tablet Take 1 tablet (10 mg total) by mouth every 6 (six) hours as needed for nausea. 20 tablet 0   traZODone (DESYREL) 100 MG tablet Take 1 tablet (100 mg total) by mouth at bedtime. 30 tablet 5   No current facility-administered medications for this visit.    Medication Side Effects: None  Allergies: No Known Allergies  Past Medical History:  Diagnosis Date   Anxiety    Depression    Mental disorder     Past Medical History, Surgical history, Social history, and Family history were reviewed and updated as appropriate.   Please see review of systems for further details on the patient's review from today.   Objective:   Physical Exam:  There were no vitals taken for this visit.  Physical Exam Constitutional:      General: He is not in acute distress. Musculoskeletal:        General: No deformity.  Neurological:     Mental Status: He is alert and oriented to person, place, and time.     Coordination: Coordination normal.  Psychiatric:        Attention and Perception: Attention  and perception normal. He does not perceive auditory or visual hallucinations.        Mood and Affect: Mood normal. Mood is not anxious or depressed. Affect is not labile, blunt, angry or inappropriate.        Speech: Speech normal.        Behavior: Behavior normal.        Thought Content: Thought content normal. Thought content is not paranoid or delusional. Thought content does not include homicidal or suicidal ideation. Thought content does not include homicidal or suicidal plan.        Cognition and Memory: Cognition and memory normal.         Judgment: Judgment normal.     Comments: Insight intact     Lab Review:     Component Value Date/Time   NA 137 12/28/2013 1834   K 3.9 12/28/2013 1834   CL 96 12/28/2013 1834   CO2 25 12/28/2013 1834   GLUCOSE 106 (H) 12/28/2013 1834   BUN 13 12/28/2013 1834   CREATININE 1.07 12/28/2013 1834   CALCIUM 9.3 12/28/2013 1834   PROT 7.7 12/28/2013 1834   ALBUMIN 4.1 12/28/2013 1834   AST 88 (H) 12/28/2013 1834   ALT 67 (H) 12/28/2013 1834   ALKPHOS 75 12/28/2013 1834   BILITOT 1.4 (H) 12/28/2013 1834   GFRNONAA >90 12/28/2013 1834   GFRAA >90 12/28/2013 1834       Component Value Date/Time   WBC 12.2 (H) 12/28/2013 1834   RBC 5.25 12/28/2013 1834   HGB 17.2 (H) 12/28/2013 1834   HCT 47.7 12/28/2013 1834   PLT 209 12/28/2013 1834   MCV 90.9 12/28/2013 1834   MCH 32.8 12/28/2013 1834   MCHC 36.1 (H) 12/28/2013 1834   RDW 12.3 12/28/2013 1834   LYMPHSABS 3.0 12/28/2013 1834   MONOABS 0.7 12/28/2013 1834   EOSABS 0.1 12/28/2013 1834   BASOSABS 0.0 12/28/2013 1834    No results found for: "POCLITH", "LITHIUM"   No results found for: "PHENYTOIN", "PHENOBARB", "VALPROATE", "CBMZ"   .res Assessment: Plan:    Plan:  PDMP reviewed  1. Lexapro 10mg  daily 2. Trazadone 100mg  daily 3. Clonazepam 1mg  TID for increased anxiety and panic. 4. Wellbutrin XL 450mg  daily. Denies seizure history.  RTC 3 months  Patient advised to contact office with any questions, adverse effects, or acute worsening in signs and symptoms.  Discussed potential benefits, risk, and side effects of benzodiazepines to include potential risk of tolerance and dependence, as well as possible drowsiness.  Advised patient not to drive if experiencing drowsiness and to take lowest possible effective dose to minimize risk of dependence and tolerance.  Diagnoses and all orders for this visit:  Major depressive disorder, recurrent episode, moderate (HCC)  Generalized anxiety disorder -     clonazePAM  (KLONOPIN) 1 MG tablet; Take 1 tablet (1 mg total) by mouth 3 (three) times daily as needed for anxiety.  Panic attacks -     clonazePAM (KLONOPIN) 1 MG tablet; Take 1 tablet (1 mg total) by mouth 3 (three) times daily as needed for anxiety.  Insomnia, unspecified type  PTSD (post-traumatic stress disorder)     Please see After Visit Summary for patient specific instructions.  No future appointments.  No orders of the defined types were placed in this encounter.   -------------------------------

## 2022-07-28 ENCOUNTER — Telehealth: Payer: Self-pay | Admitting: Adult Health

## 2022-07-28 NOTE — Telephone Encounter (Signed)
Patient called stating that he needs another copy of a letter RM wrote for him to give to his HR dept. He would like a rtc as well. [phone]

## 2022-07-28 NOTE — Telephone Encounter (Signed)
Called patient to see what information he was looking for in a letter and he said he had talked to HR and he thinks he got the issue worked out. At this time he said he didn't need anything from [place], would call back if he did.

## 2022-08-06 ENCOUNTER — Telehealth: Payer: Self-pay | Admitting: Adult Health

## 2022-08-06 NOTE — Telephone Encounter (Signed)
Received FMLA paperwork from his job and gave it to [employer]

## 2022-08-11 ENCOUNTER — Telehealth: Payer: Self-pay | Admitting: Adult Health

## 2022-08-11 NOTE — Telephone Encounter (Signed)
Do we have paper work on him? 

## 2022-08-11 NOTE — Telephone Encounter (Signed)
Yes. Has due date of 10/26 on it. Not sure what the issue would be.

## 2022-08-11 NOTE — Telephone Encounter (Signed)
Could we call and see what it is they need?

## 2022-08-11 NOTE — Telephone Encounter (Signed)
Pt called reporting paperwork is due 10/26 but his employer is giving him a hard time about his job. Requesting a letter to be faxed to employer stating under our care etc. Before paperwork is completed. Fax # on paperwork pt said. Pt contact # [phone]

## 2022-08-11 NOTE — Telephone Encounter (Signed)
Noted paper work due by 10/26

## 2022-08-12 NOTE — Telephone Encounter (Signed)
Left HR manager a message in regards to what needs to be in his letter. Will complete FMLA as soon as I can as well.

## 2022-08-14 DIAGNOSIS — Z0289 Encounter for other administrative examinations: Secondary | ICD-10-CM

## 2022-08-14 NOTE — Telephone Encounter (Signed)
Contacted pt to get dates for his FMLA, he is having a lot of trouble with HR. May have lost his job but they will talk to him on Monday, 08/17/2022. FMLA completed and signed. Faxed per request

## 2022-09-04 ENCOUNTER — Encounter: Payer: Self-pay | Admitting: Adult Health

## 2022-09-04 ENCOUNTER — Telehealth (INDEPENDENT_AMBULATORY_CARE_PROVIDER_SITE_OTHER): Payer: Commercial Managed Care - PPO | Admitting: Adult Health

## 2022-09-04 DIAGNOSIS — F411 Generalized anxiety disorder: Secondary | ICD-10-CM | POA: Diagnosis not present

## 2022-09-04 DIAGNOSIS — F4310 Post-traumatic stress disorder, unspecified: Secondary | ICD-10-CM | POA: Diagnosis not present

## 2022-09-04 DIAGNOSIS — F331 Major depressive disorder, recurrent, moderate: Secondary | ICD-10-CM | POA: Diagnosis not present

## 2022-09-04 DIAGNOSIS — F410 Panic disorder [episodic paroxysmal anxiety] without agoraphobia: Secondary | ICD-10-CM

## 2022-09-04 DIAGNOSIS — G4700 Insomnia, unspecified: Secondary | ICD-10-CM

## 2022-09-04 MED ORDER — SERTRALINE HCL 50 MG PO TABS: 50.0000 mg | ORAL_TABLET | Freq: Every day | ORAL | 5 refills | Status: DC

## 2022-09-04 MED ORDER — TRAZODONE HCL 100 MG PO TABS: 100.0000 mg | ORAL_TABLET | Freq: Every day | ORAL | 5 refills | Status: DC

## 2022-09-04 MED ORDER — CLONAZEPAM 1 MG PO TABS: 1.0000 mg | ORAL_TABLET | Freq: Three times a day (TID) | ORAL | 2 refills | Status: DC | PRN

## 2022-09-04 MED ORDER — BUPROPION HCL ER (XL) 150 MG PO TB24: ORAL_TABLET | ORAL | 5 refills | Status: DC

## 2022-09-04 NOTE — Progress Notes (Signed)
Ky Rumple [id number] [birth year] 41 y.o.  Virtual Visit via Video Note  I connected with pt @ on 09/04/22 at  9:00 AM EST by a video enabled telemedicine application and verified that I am speaking with the correct person using two identifiers.   I discussed the limitations of evaluation and management by telemedicine and the availability of in person appointments. The patient expressed understanding and agreed to proceed.  I discussed the assessment and treatment plan with the patient. The patient was provided an opportunity to ask questions and all were answered. The patient agreed with the plan and demonstrated an understanding of the instructions.   The patient was advised to call back or seek an in-person evaluation if the symptoms worsen or if the condition fails to improve as anticipated.  I provided 25 minutes of non-face-to-face time during this encounter.  The patient was located at home.  The provider was located at [facility].   Dorothyann Gibbs, NP   Subjective:   Patient ID:  Alfred Hicks is a 41 y.o. (DOB Aug 20, 1981) male.  Chief Complaint: No chief complaint on file.   HPI Alfred Hicks presents for follow-up of MDD, GAD, PTSD, panic attacks, and insomnia.  Describes mood today as "not the best". Pleasant. Decreased tearfulness. Mood symptoms - reports increased depression and anxiety over the past month - missing work - got fired - talked to Network engineer and got job back. Was having panic attacks multiple times a day - calmed down now. Denies irritability. Increased worry and rumination - "mostly financial" - trying to get bills caught back up. Stating "I'm doing better than I was". Reports situational stressors. Improved interest and motivation. Taking medications as prescribed. Has started seeing a therapist. Energy levels "up and down". Active, does not have a regular exercise routine.   Enjoys some usual interests and activities.  Single. Lives alone with yorkie (age 22). Has 1 son - teenager. Brother local.  Appetite adequate. Weight loss 15 pounds - stopped sugar and caffeine. Sleeping better some nights than others. Averages - 6 hours of broken sleep.  Focus and concentration stable. Completing tasks. Managing aspects of household. Works full time - 50 to 60 hours a week.  Denies SI or HI.  Denies AH or VH. Denies self harm. Denies substance use.  Previous medication trials: Multiple medication trials.  Review of Systems:  Review of Systems  Musculoskeletal:  Negative for gait problem.  Neurological:  Negative for tremors.  Psychiatric/Behavioral:         Please refer to HPI    Medications: I have reviewed the patient's current medications.  Current Outpatient Medications  Medication Sig Dispense Refill   acetaminophen (TYLENOL) 500 MG tablet Take 500 mg by mouth every 6 (six) hours as needed (For stress headaches.).     buPROPion (WELLBUTRIN XL) 150 MG 24 hr tablet TAKE 3 TABLETS BY MOUTH EVERY MORNING 90 tablet 5   clonazePAM (KLONOPIN) 1 MG tablet Take 1 tablet (1 mg total) by mouth 3 (three) times daily as needed for anxiety. 90 tablet 2   escitalopram (LEXAPRO) 10 MG tablet Take 1 tablet (10 mg total) by mouth daily. 30 tablet 5   loperamide (IMODIUM) 2 MG capsule Take 2 mg by mouth as needed for diarrhea or loose stools.     metoCLOPramide (REGLAN) 10 MG tablet Take 1 tablet (10 mg total) by mouth every 6 (six) hours as needed for nausea. 20 tablet 0   traZODone (DESYREL) 100  MG tablet Take 1 tablet (100 mg total) by mouth at bedtime. 30 tablet 5   No current facility-administered medications for this visit.    Medication Side Effects: None  Allergies: No Known Allergies  Past Medical History:  Diagnosis Date   Anxiety    Depression    Mental disorder     No family history on file.  Social History   Socioeconomic History   Marital status: Single    Spouse name: Not on file   Number  of children: Not on file   Years of education: Not on file   Highest education level: Not on file  Occupational History   Not on file  Tobacco Use   Smoking status: Every Day    Packs/day: 1.50    Years: 15.00    Total pack years: 22.50    Types: Cigarettes   Smokeless tobacco: Never  Substance and Sexual Activity   Alcohol use: Yes    Alcohol/week: 0.0 standard drinks of alcohol    Comment: none since discharge from [facility] 08/2012   Drug use: No   Sexual activity: Yes    Birth control/protection: Condom    Comment: GF on birth control  Other Topics Concern   Not on file  Social History Narrative   Not on file   Social Determinants of Health   Financial Resource Strain: Not on file  Food Insecurity: Not on file  Transportation Needs: Not on file  Physical Activity: Not on file  Stress: Not on file  Social Connections: Not on file  Intimate Partner Violence: Not on file    Past Medical History, Surgical history, Social history, and Family history were reviewed and updated as appropriate.   Please see review of systems for further details on the patient's review from today.   Objective:   Physical Exam:  There were no vitals taken for this visit.  Physical Exam Constitutional:      General: He is not in acute distress. Musculoskeletal:        General: No deformity.  Neurological:     Mental Status: He is alert and oriented to person, place, and time.     Coordination: Coordination normal.  Psychiatric:        Attention and Perception: Attention and perception normal. He does not perceive auditory or visual hallucinations.        Mood and Affect: Mood normal. Mood is not anxious or depressed. Affect is not labile, blunt, angry or inappropriate.        Speech: Speech normal.        Behavior: Behavior normal.        Thought Content: Thought content normal. Thought content is not paranoid or delusional. Thought content does not include homicidal or suicidal ideation.  Thought content does not include homicidal or suicidal plan.        Cognition and Memory: Cognition and memory normal.        Judgment: Judgment normal.     Comments: Insight intact     Lab Review:     Component Value Date/Time   NA 137 12/28/2013 1834   K 3.9 12/28/2013 1834   CL 96 12/28/2013 1834   CO2 25 12/28/2013 1834   GLUCOSE 106 (H) 12/28/2013 1834   BUN 13 12/28/2013 1834   CREATININE 1.07 12/28/2013 1834   CALCIUM 9.3 12/28/2013 1834   PROT 7.7 12/28/2013 1834   ALBUMIN 4.1 12/28/2013 1834   AST 88 (H) 12/28/2013 1834   ALT  67 (H) 12/28/2013 1834   ALKPHOS 75 12/28/2013 1834   BILITOT 1.4 (H) 12/28/2013 1834   GFRNONAA >90 12/28/2013 1834   GFRAA >90 12/28/2013 1834       Component Value Date/Time   WBC 12.2 (H) 12/28/2013 1834   RBC 5.25 12/28/2013 1834   HGB 17.2 (H) 12/28/2013 1834   HCT 47.7 12/28/2013 1834   PLT 209 12/28/2013 1834   MCV 90.9 12/28/2013 1834   MCH 32.8 12/28/2013 1834   MCHC 36.1 (H) 12/28/2013 1834   RDW 12.3 12/28/2013 1834   LYMPHSABS 3.0 12/28/2013 1834   MONOABS 0.7 12/28/2013 1834   EOSABS 0.1 12/28/2013 1834   BASOSABS 0.0 12/28/2013 1834    No results found for: "POCLITH", "LITHIUM"   No results found for: "PHENYTOIN", "PHENOBARB", "VALPROATE", "CBMZ"   .res Assessment: Plan:    Plan:  PDMP reviewed  D/C Lexapro 10mg  daily - take 1/2 tablet daily for 7 days, then discontinue Add Zoloft 50mg  daily -  take 1/2 tablet daily for 7 days, then increase to one tablet daily Trazadone 100mg  daily Clonazepam 1mg  TID for increased anxiety and panic - taking 2 to 3 daily Wellbutrin XL 450mg  daily. Denies seizure history.  RTC 3 months  Patient advised to contact office with any questions, adverse effects, or acute worsening in signs and symptoms.  Discussed potential benefits, risk, and side effects of benzodiazepines to include potential risk of tolerance and dependence, as well as possible drowsiness.  Advised patient  not to drive if experiencing drowsiness and to take lowest possible effective dose to minimize risk of dependence and tolerance.  There are no diagnoses linked to this encounter.   Please see After Visit Summary for patient specific instructions.  No future appointments.  No orders of the defined types were placed in this encounter.     -------------------------------

## 2022-12-10 ENCOUNTER — Encounter: Payer: Self-pay | Admitting: Adult Health

## 2022-12-10 ENCOUNTER — Telehealth (INDEPENDENT_AMBULATORY_CARE_PROVIDER_SITE_OTHER): Payer: Commercial Managed Care - PPO | Admitting: Adult Health

## 2022-12-10 DIAGNOSIS — G4700 Insomnia, unspecified: Secondary | ICD-10-CM

## 2022-12-10 DIAGNOSIS — F411 Generalized anxiety disorder: Secondary | ICD-10-CM | POA: Diagnosis not present

## 2022-12-10 DIAGNOSIS — F410 Panic disorder [episodic paroxysmal anxiety] without agoraphobia: Secondary | ICD-10-CM

## 2022-12-10 DIAGNOSIS — F331 Major depressive disorder, recurrent, moderate: Secondary | ICD-10-CM

## 2022-12-10 MED ORDER — CLONAZEPAM 1 MG PO TABS: 1.0000 mg | ORAL_TABLET | Freq: Three times a day (TID) | ORAL | 2 refills | Status: DC | PRN

## 2022-12-10 NOTE — Progress Notes (Signed)
Alfred Hicks [id number] [birth year] 42 y.o.  Virtual Visit via Video Note  I connected with pt @ on 12/10/22 at  8:40 AM EST by a video enabled telemedicine application and verified that I am speaking with the correct person using two identifiers.   I discussed the limitations of evaluation and management by telemedicine and the availability of in person appointments. The patient expressed understanding and agreed to proceed.  I discussed the assessment and treatment plan with the patient. The patient was provided an opportunity to ask questions and all were answered. The patient agreed with the plan and demonstrated an understanding of the instructions.   The patient was advised to call back or seek an in-person evaluation if the symptoms worsen or if the condition fails to improve as anticipated.  I provided 25 minutes of non-face-to-face time during this encounter.  The patient was located at home.  The provider was located at [place].   Alfred Gell, NP   Subjective:   Patient ID:  Alfred Hicks is a 42 y.o. (DOB [birth year]) male.  Chief Complaint: No chief complaint on file.   HPI Alfred Hicks presents for follow-up of MDD, GAD, PTSD, panic attacks, and insomnia.  Describes mood today as "not the best". Pleasant. Reports tearfulness. Mood symptoms - reports increased depression - "really depressed lately". Reports anxiety - "always" - reports emesis. Denies irritability. Reports worry and rumination - financial. Reports panic attacks. Reports struggling getting to work. Stating "I haven't been doing to good". Feels like medications are helpful. Varying  interest and motivation. Taking medications as prescribed. Has started seeing a therapist. Energy levels vary. Active, does not have a regular exercise routine.   Enjoys some usual interests and activities. Single. Lives alone with yorkie (age 2). Has 1 son - teenager. Brother local.   Appetite adequate. Weight loss.  Sleeping difficulties. Averages - 2 to 3 hours a night.  Focus and concentration stable. Completing tasks. Managing aspects of household. Works full time - 50 to 60 hours a week.  Denies SI or HI.  Denies AH or VH. Denies self harm. Denies substance use.  Previous medication trials: Multiple medication trials.   Review of Systems:  Review of Systems  Musculoskeletal:  Negative for gait problem.  Neurological:  Negative for tremors.  Psychiatric/Behavioral:         Please refer to HPI    Medications: I have reviewed the patient's current medications.  Current Outpatient Medications  Medication Sig Dispense Refill   acetaminophen (TYLENOL) 500 MG tablet Take 500 mg by mouth every 6 (six) hours as needed (For stress headaches.).     buPROPion (WELLBUTRIN XL) 150 MG 24 hr tablet TAKE 3 TABLETS BY MOUTH EVERY MORNING 90 tablet 5   clonazePAM (KLONOPIN) 1 MG tablet Take 1 tablet (1 mg total) by mouth 3 (three) times daily as needed for anxiety. 90 tablet 2   loperamide (IMODIUM) 2 MG capsule Take 2 mg by mouth as needed for diarrhea or loose stools.     metoCLOPramide (REGLAN) 10 MG tablet Take 1 tablet (10 mg total) by mouth every 6 (six) hours as needed for nausea. 20 tablet 0   sertraline (ZOLOFT) 50 MG tablet Take 1 tablet (50 mg total) by mouth daily. 30 tablet 5   traZODone (DESYREL) 100 MG tablet Take 1 tablet (100 mg total) by mouth at bedtime. 30 tablet 5   No current facility-administered medications for this visit.    Medication Side  Effects: None  Allergies: No Known Allergies  Past Medical History:  Diagnosis Date   Anxiety    Depression    Mental disorder     No family history on file.  Social History   Socioeconomic History   Marital status: Single    Spouse name: Not on file   Number of children: Not on file   Years of education: Not on file   Highest education level: Not on file  Occupational History   Not on file   Tobacco Use   Smoking status: Every Day    Packs/day: 1.50    Years: 15.00    Total pack years: 22.50    Types: Cigarettes   Smokeless tobacco: Never  Substance and Sexual Activity   Alcohol use: Yes    Alcohol/week: 0.0 standard drinks of alcohol    Comment: none since discharge from [facility] 08/2012   Drug use: No   Sexual activity: Yes    Birth control/protection: Condom    Comment: GF on birth control  Other Topics Concern   Not on file  Social History Narrative   Not on file   Social Determinants of Health   Financial Resource Strain: Not on file  Food Insecurity: Not on file  Transportation Needs: Not on file  Physical Activity: Not on file  Stress: Not on file  Social Connections: Not on file  Intimate Partner Violence: Not on file    Past Medical History, Surgical history, Social history, and Family history were reviewed and updated as appropriate.   Please see review of systems for further details on the patient's review from today.   Objective:   Physical Exam:  There were no vitals taken for this visit.  Physical Exam Constitutional:      General: He is not in acute distress. Musculoskeletal:        General: No deformity.  Neurological:     Mental Status: He is alert and oriented to person, place, and time.     Coordination: Coordination normal.  Psychiatric:        Attention and Perception: Attention and perception normal. He does not perceive auditory or visual hallucinations.        Mood and Affect: Mood normal. Mood is not anxious or depressed. Affect is not labile, blunt, angry or inappropriate.        Speech: Speech normal.        Behavior: Behavior normal.        Thought Content: Thought content normal. Thought content is not paranoid or delusional. Thought content does not include homicidal or suicidal ideation. Thought content does not include homicidal or suicidal plan.        Cognition and Memory: Cognition and memory normal.        Judgment:  Judgment normal.     Comments: Insight intact     Lab Review:     Component Value Date/Time   NA 137 12/28/2013 1834   K 3.9 12/28/2013 1834   CL 96 12/28/2013 1834   CO2 25 12/28/2013 1834   GLUCOSE 106 (H) 12/28/2013 1834   BUN 13 12/28/2013 1834   CREATININE 1.07 12/28/2013 1834   CALCIUM 9.3 12/28/2013 1834   PROT 7.7 12/28/2013 1834   ALBUMIN 4.1 12/28/2013 1834   AST 88 (H) 12/28/2013 1834   ALT 67 (H) 12/28/2013 1834   ALKPHOS 75 12/28/2013 1834   BILITOT 1.4 (H) 12/28/2013 1834   GFRNONAA >90 12/28/2013 1834   GFRAA >90 12/28/2013 1834  Component Value Date/Time   WBC 12.2 (H) 12/28/2013 1834   RBC 5.25 12/28/2013 1834   HGB 17.2 (H) 12/28/2013 1834   HCT 47.7 12/28/2013 1834   PLT 209 12/28/2013 1834   MCV 90.9 12/28/2013 1834   MCH 32.8 12/28/2013 1834   MCHC 36.1 (H) 12/28/2013 1834   RDW 12.3 12/28/2013 1834   LYMPHSABS 3.0 12/28/2013 1834   MONOABS 0.7 12/28/2013 1834   EOSABS 0.1 12/28/2013 1834   BASOSABS 0.0 12/28/2013 1834    No results found for: "POCLITH", "LITHIUM"   No results found for: "PHENYTOIN", "PHENOBARB", "VALPROATE", "CBMZ"   .res Assessment: Plan:   Plan:  PDMP reviewed  Zoloft 3m daily  Clonazepam 120mTID for increased anxiety and panic - taking 2 to 3 daily Wellbutrin XL 45050maily. Denies seizure history.  Trazadone 100m70mily - discussed sleep hygiene - plans to work on a routine.  RTC 3 months  Patient advised to contact office with any questions, adverse effects, or acute worsening in signs and symptoms.  Discussed potential benefits, risk, and side effects of benzodiazepines to include potential risk of tolerance and dependence, as well as possible drowsiness. Advised patient not to drive if experiencing drowsiness and to take lowest possible effective dose to minimize risk of dependence and tolerance.  Diagnoses and all orders for this visit:  Major depressive disorder, recurrent episode, moderate  (HCC)  Generalized anxiety disorder -     clonazePAM (KLONOPIN) 1 MG tablet; Take 1 tablet (1 mg total) by mouth 3 (three) times daily as needed for anxiety.  Panic attacks -     clonazePAM (KLONOPIN) 1 MG tablet; Take 1 tablet (1 mg total) by mouth 3 (three) times daily as needed for anxiety.  Insomnia, unspecified type     Please see After Visit Summary for patient specific instructions.  No future appointments.   No orders of the defined types were placed in this encounter.     -------------------------------

## 2022-12-17 ENCOUNTER — Telehealth: Payer: Self-pay | Admitting: Adult Health

## 2022-12-17 NOTE — Telephone Encounter (Signed)
Ok, hopefully we will be able to reach him. I just spoke to him a week ago.

## 2022-12-17 NOTE — Telephone Encounter (Signed)
Pt called at 10:57a.  He wants to talk to Barnett Applebaum about his meds.  He thinks he's having problems with one of his anti-depressants.  Next appt 5/14

## 2022-12-17 NOTE — Telephone Encounter (Signed)
I could not get any information from patient as to what his concerns are. He only wanted to talk to you. He was upset that we had not called him sooner. I told him that I had called twice and got message that call could not be completed. He said the cell service at his house was not very good and apologized.

## 2022-12-17 NOTE — Telephone Encounter (Signed)
Called twice and get message that call cannot be completed at this time. Will continue to try to reach him.

## 2022-12-18 ENCOUNTER — Telehealth: Payer: Self-pay

## 2022-12-18 NOTE — Telephone Encounter (Signed)
He needs to get his sleep schedule under control. We discussed this in detail last visit - I don't think his mood will improve until the sleep is managed. We can send in Seroquel '50mg'$  - 1 to 2 at hs to help with racing thoughts and depression.

## 2022-12-18 NOTE — Telephone Encounter (Signed)
Patient said he asked you to switch him to Zoloft due to SE of prior medication. He said he wasn't comfortable talking to me about those SE. He feels like the bupropion is a "placebo". He rates his depression as a 12. He is antisocial. He is being an AH to everyone and snapping at people. He has no sleep schedule. I see that in your note from visit last week this was discussed. He said his mind is racing at night and he can't go to bed and if he forgets to take the trazodone earlier in the night he can't take it because he is unable to wake up.  I asked him what changed from his visit last week and he said he thought his body just needed time to adjust to the switch to Zoloft. He wants to stop it. I told him to not stop it until it was okay by you.  He said he would prefer you call him. He is not scheduled for F/U until May.

## 2022-12-18 NOTE — Telephone Encounter (Signed)
Patient called back today and another note was put in.

## 2022-12-21 ENCOUNTER — Other Ambulatory Visit: Payer: Self-pay

## 2022-12-21 MED ORDER — QUETIAPINE FUMARATE 50 MG PO TABS: 50.0000 mg | ORAL_TABLET | Freq: Every day | ORAL | 0 refills | Status: DC

## 2022-12-21 NOTE — Telephone Encounter (Signed)
Call cannot be completed at this time. When I had this issue last week patient said his cell service was not good at his house. Unable to leave message.

## 2022-12-21 NOTE — Telephone Encounter (Signed)
Rx sent for Seroquel. When I called patient to let him know he said that he is weaning himself off his antidepressants because he feels like they are doing the opposite of what they are supposed to do. He has cut his Wellbutrin and Zoloft in half.

## 2022-12-21 NOTE — Telephone Encounter (Signed)
Noted  

## 2023-01-20 ENCOUNTER — Emergency Department (HOSPITAL_COMMUNITY): Payer: Commercial Managed Care - PPO

## 2023-01-20 ENCOUNTER — Encounter (HOSPITAL_COMMUNITY): Payer: Self-pay

## 2023-01-20 ENCOUNTER — Emergency Department (HOSPITAL_COMMUNITY)
Admission: EM | Admit: 2023-01-20 | Discharge: 2023-01-20 | Discharge status: Against Medical Advice | Payer: Commercial Managed Care - PPO | Attending: Emergency Medicine | Admitting: Emergency Medicine

## 2023-01-20 ENCOUNTER — Other Ambulatory Visit: Payer: Self-pay

## 2023-01-20 DIAGNOSIS — D72829 Elevated white blood cell count, unspecified: Secondary | ICD-10-CM | POA: Insufficient documentation

## 2023-01-20 DIAGNOSIS — R600 Localized edema: Secondary | ICD-10-CM | POA: Insufficient documentation

## 2023-01-20 DIAGNOSIS — Z5329 Procedure and treatment not carried out because of patient's decision for other reasons: Secondary | ICD-10-CM | POA: Insufficient documentation

## 2023-01-20 DIAGNOSIS — F1010 Alcohol abuse, uncomplicated: Secondary | ICD-10-CM | POA: Insufficient documentation

## 2023-01-20 DIAGNOSIS — K7031 Alcoholic cirrhosis of liver with ascites: Secondary | ICD-10-CM | POA: Diagnosis not present

## 2023-01-20 DIAGNOSIS — R609 Edema, unspecified: Secondary | ICD-10-CM

## 2023-01-20 DIAGNOSIS — R1907 Generalized intra-abdominal and pelvic swelling, mass and lump: Secondary | ICD-10-CM | POA: Diagnosis present

## 2023-01-20 DIAGNOSIS — Y906 Blood alcohol level of 120-199 mg/100 ml: Secondary | ICD-10-CM | POA: Insufficient documentation

## 2023-01-20 LAB — CBC WITH DIFFERENTIAL/PLATELET
Abs Immature Granulocytes: 0.09 10*3/uL — ABNORMAL HIGH (ref 0.00–0.07)
Basophils Absolute: 0.1 10*3/uL (ref 0.0–0.1)
Basophils Relative: 1 %
Eosinophils Absolute: 0 10*3/uL (ref 0.0–0.5)
Eosinophils Relative: 0 %
HCT: 42.8 % (ref 39.0–52.0)
Hemoglobin: 14.3 g/dL (ref 13.0–17.0)
Immature Granulocytes: 1 %
Lymphocytes Relative: 11 %
Lymphs Abs: 1.4 10*3/uL (ref 0.7–4.0)
MCH: 34.4 pg — ABNORMAL HIGH (ref 26.0–34.0)
MCHC: 33.4 g/dL (ref 30.0–36.0)
MCV: 102.9 fL — ABNORMAL HIGH (ref 80.0–100.0)
Monocytes Absolute: 0.8 10*3/uL (ref 0.1–1.0)
Monocytes Relative: 6 %
Neutro Abs: 10.2 10*3/uL — ABNORMAL HIGH (ref 1.7–7.7)
Neutrophils Relative %: 81 %
Platelets: 184 10*3/uL (ref 150–400)
RBC: 4.16 MIL/uL — ABNORMAL LOW (ref 4.22–5.81)
RDW: 13.7 % (ref 11.5–15.5)
WBC: 12.6 10*3/uL — ABNORMAL HIGH (ref 4.0–10.5)
nRBC: 0 % (ref 0.0–0.2)

## 2023-01-20 LAB — COMPREHENSIVE METABOLIC PANEL
ALT: 66 U/L — ABNORMAL HIGH (ref 0–44)
AST: 214 U/L — ABNORMAL HIGH (ref 15–41)
Albumin: 3.2 g/dL — ABNORMAL LOW (ref 3.5–5.0)
Alkaline Phosphatase: 160 U/L — ABNORMAL HIGH (ref 38–126)
Anion gap: 11 (ref 5–15)
BUN: <5 mg/dL — ABNORMAL LOW (ref 6–20)
CO2: 25 mmol/L (ref 22–32)
Calcium: 8.1 mg/dL — ABNORMAL LOW (ref 8.9–10.3)
Chloride: 99 mmol/L (ref 98–111)
Creatinine, Ser: 0.59 mg/dL — ABNORMAL LOW (ref 0.61–1.24)
GFR, Estimated: >60 mL/min (ref >60)
Glucose, Bld: 130 mg/dL — ABNORMAL HIGH (ref 70–99)
Potassium: 3.7 mmol/L (ref 3.5–5.1)
Sodium: 135 mmol/L (ref 135–145)
Total Bilirubin: 1.1 mg/dL (ref 0.3–1.2)
Total Protein: 7.1 g/dL (ref 6.5–8.1)

## 2023-01-20 LAB — PROTIME-INR
INR: 1.2 (ref 0.8–1.2)
Prothrombin Time: 15.1 seconds (ref 11.4–15.2)

## 2023-01-20 LAB — ETHANOL: Alcohol, Ethyl (B): 178 mg/dL — ABNORMAL HIGH (ref <10)

## 2023-01-20 LAB — LIPASE, BLOOD: Lipase: 41 U/L (ref 11–51)

## 2023-01-20 MED ORDER — IOHEXOL 300 MG/ML  SOLN
100.0000 mL | Freq: Once | INTRAMUSCULAR | Status: AC | PRN
  Administered 2023-01-20: 100 mL via INTRAVENOUS

## 2023-01-20 MED ORDER — FUROSEMIDE 20 MG PO TABS: 20.0000 mg | ORAL_TABLET | Freq: Every day | ORAL | 0 refills | Status: DC

## 2023-01-20 MED ORDER — POTASSIUM CHLORIDE ER 10 MEQ PO TBCR: 10.0000 meq | EXTENDED_RELEASE_TABLET | Freq: Every day | ORAL | 0 refills | Status: AC

## 2023-01-20 NOTE — Discharge Instructions (Signed)
You were seen in the emergency department for swelling of your legs and abdomen.  You had a workup including lab work and a CAT scan.  You had new onset fluid buildup likely due to alcoholic liver disease.  We discussed admission to the hospital for further evaluation and you are unable to stay.  Will be important that you either closely follow-up with your primary care doctor or return to the emergency department for further evaluation.  We are starting you on a fluid pill and potassium supplement.  It will be very important for you to abstain from alcohol to aid in your treatment.

## 2023-01-20 NOTE — ED Provider Triage Note (Cosign Needed)
Emergency Medicine Provider Triage Evaluation Note  Alfred Hicks , a 42 y.o. male  was evaluated in triage.  Pt complains of abdominal distention and shortness of breath.  Began approximately 2 months ago and has gotten worse.  States his shortness of breath is due to the abdominal bloating.  States he was a heavy drinker until approximately 6 months ago but has started drinking heavily again recently.  No history of heart failure or cirrhosis that he is aware of.  Denies chest pain.  Review of Systems  Positive: As above Negative: As above  Physical Exam  BP (!) 136/92 (BP Location: Right Arm)   Pulse (!) 107   Temp 98.7 F (37.1 C) (Oral)   Resp 18   Ht 5\' 9"  (1.753 m)   Wt 98.4 kg   SpO2 97%   BMI 32.05 kg/m  Gen:   Awake, no distress   Resp:  Normal effort  MSK:   Moves extremities without difficulty  Other:    Medical Decision Making  Medically screening exam initiated at 4:13 PM.  Appropriate orders placed.  Alfred Hicks was informed that the remainder of the evaluation will be completed by another provider, this initial triage assessment does not replace that evaluation, and the importance of remaining in the ED until their evaluation is complete.  Labs ordered   Alfred Hicks 01/20/23 1614

## 2023-01-20 NOTE — ED Notes (Signed)
Patient transported to CT 

## 2023-01-20 NOTE — ED Triage Notes (Signed)
Pt reports swelling to his abdomen and into his legs, onset 2 weeks ago. Denies heart or kidney problems. Denies chest pain/shortness of breath. +20lb weight gain in 2 weeks.

## 2023-01-20 NOTE — ED Provider Notes (Signed)
[facility] Provider Note   CSN: [id number] Arrival date & time: 01/20/23  1531     History {Add pertinent medical, surgical, social history, OB history to HPI:1} Chief Complaint  Patient presents with   Fluid Retention    Alfred Hicks is a 42 y.o. male.  He is here with a complaint of swelling of his legs into his abdomen it has been going on a few weeks.  He has never had this before.  He said he was concerned about his liver in the past due to his drinking so he had some blood work done and it was normal.  He said this was months ago.  He stopped drinking in November although unclear if he has been completely abstinent.  He denies any IV drug use or other hepatitis or HIV risk factors.  No fevers or chills.  No nausea vomiting.  He said he cannot eat a normal amount of food due to early satiety.  He feels he is gained probably 30 pounds in the last couple of weeks of water weight.  The history is provided by the patient.  Abdominal Pain Pain location:  Generalized Pain quality: pressure   Pain severity:  Mild Onset quality:  Gradual Duration:  2 weeks Timing:  Constant Progression:  Worsening Chronicity:  New Relieved by:  Nothing Worsened by:  Nothing Ineffective treatments:  None tried Associated symptoms: no chest pain, no constipation, no cough, no diarrhea, no fever, no hematemesis, no hematochezia, no hematuria, no nausea, no shortness of breath and no vomiting   Risk factors: alcohol abuse        Home Medications Prior to Admission medications   Medication Sig Start Date End Date Taking? Authorizing Provider  acetaminophen (TYLENOL) 500 MG tablet Take 500 mg by mouth every 6 (six) hours as needed (For stress headaches.).    [provider]  buPROPion (WELLBUTRIN XL) 150 MG 24 hr tablet TAKE 3 TABLETS BY MOUTH EVERY MORNING 09/04/22   Mozingo, Berdie Ogren, NP  clonazePAM (KLONOPIN) 1 MG tablet Take 1  tablet (1 mg total) by mouth 3 (three) times daily as needed for anxiety. 12/10/22   Mozingo, Berdie Ogren, NP  loperamide (IMODIUM) 2 MG capsule Take 2 mg by mouth as needed for diarrhea or loose stools.    [provider]  metoCLOPramide (REGLAN) 10 MG tablet Take 1 tablet (10 mg total) by mouth every 6 (six) hours as needed for nausea. 12/28/13   Charlann Lange, PA-C  QUEtiapine (SEROQUEL) 50 MG tablet Take 1-2 tablets (50-100 mg total) by mouth at bedtime. 12/21/22   Mozingo, Berdie Ogren, NP  sertraline (ZOLOFT) 50 MG tablet Take 1 tablet (50 mg total) by mouth daily. 09/04/22   Mozingo, Berdie Ogren, NP  traZODone (DESYREL) 100 MG tablet Take 1 tablet (100 mg total) by mouth at bedtime. 09/04/22   Mozingo, Berdie Ogren, NP      Allergies    Patient has no known allergies.    Review of Systems   Review of Systems  Constitutional:  Negative for fever.  Respiratory:  Negative for cough and shortness of breath.   Cardiovascular:  Negative for chest pain.  Gastrointestinal:  Positive for abdominal pain. Negative for constipation, diarrhea, hematemesis, hematochezia, nausea and vomiting.  Genitourinary:  Negative for hematuria.    Physical Exam Updated Vital Signs BP (!) 136/92 (BP Location: Right Arm)   Pulse (!) 107   Temp 98.7 F (37.1 C) (  Oral)   Resp 18   Ht 5\' 9"  (1.753 m)   Wt 98.4 kg   SpO2 97%   BMI 32.05 kg/m  Physical Exam Vitals and nursing note reviewed.  Constitutional:      General: He is not in acute distress.    Appearance: Normal appearance. He is well-developed.  HENT:     Head: Normocephalic and atraumatic.  Eyes:     Conjunctiva/sclera: Conjunctivae normal.  Cardiovascular:     Rate and Rhythm: Normal rate and regular rhythm.     Heart sounds: No murmur heard. Pulmonary:     Effort: Pulmonary effort is normal. No respiratory distress.     Breath sounds: Normal breath sounds.  Abdominal:     General: There is distension.      Palpations: Abdomen is soft.     Tenderness: There is abdominal tenderness. There is no guarding or rebound.     Comments: Positive fluid wave  Musculoskeletal:     Cervical back: Neck supple.     Right lower leg: Edema present.     Left lower leg: Edema present.  Skin:    General: Skin is warm and dry.     Capillary Refill: Capillary refill takes less than 2 seconds.  Neurological:     General: No focal deficit present.     Mental Status: He is alert.     ED Results / Procedures / Treatments   Labs (all labs ordered are listed, but only abnormal results are displayed) Labs Reviewed  CBC WITH DIFFERENTIAL/PLATELET - Abnormal; Notable for the following components:      Result Value   WBC 12.6 (*)    RBC 4.16 (*)    MCV 102.9 (*)    MCH 34.4 (*)    Neutro Abs 10.2 (*)    Abs Immature Granulocytes 0.09 (*)    All other components within normal limits  COMPREHENSIVE METABOLIC PANEL - Abnormal; Notable for the following components:   Glucose, Bld 130 (*)    BUN <5 (*)    Creatinine, Ser 0.59 (*)    Calcium 8.1 (*)    Albumin 3.2 (*)    AST 214 (*)    ALT 66 (*)    Alkaline Phosphatase 160 (*)    All other components within normal limits  LIPASE, BLOOD  URINALYSIS, ROUTINE W REFLEX MICROSCOPIC    EKG EKG Interpretation  Date/Time:  Wednesday January 20 2023 16:01:27 EDT Ventricular Rate:  107 PR Interval:  144 QRS Duration: 82 QT Interval:  326 QTC Calculation: 435 R Axis:   54 Text Interpretation: Sinus tachycardia Ventricular premature complex Aberrant complex Borderline T abnormalities, diffuse leads No old tracing to compare Confirmed by Aletta Edouard [phone]) on 01/20/2023 4:04:09 PM  Radiology DG Chest 2 View  Result Date: 01/20/2023 CLINICAL DATA:  Shortness of breath. EXAM: CHEST - 2 VIEW COMPARISON:  12/13/2012. FINDINGS: Low lung volumes accentuate the pulmonary vasculature and cardiomediastinal silhouette. No focal airspace opacity. Trace right pleural  effusion. No pneumothorax. IMPRESSION: Trace right pleural effusion. Electronically Signed   By: Emmit Alexanders M.D.   On: 01/20/2023 17:01    Procedures Procedures  {Document cardiac monitor, telemetry assessment procedure when appropriate:1}  Medications Ordered in ED Medications - No data to display  ED Course/ Medical Decision Making/ A&P   {   Click here for ABCD2, HEART and other calculatorsREFRESH Note before signing :1}  Medical Decision Making Amount and/or Complexity of Data Reviewed Labs: ordered. Radiology: ordered.   This patient complains of ***; this involves an extensive number of treatment Options and is a complaint that carries with it a high risk of complications and morbidity. The differential includes ***  I ordered, reviewed and interpreted labs, which included *** I ordered medication *** and reviewed PMP when indicated. I ordered imaging studies which included *** and I independently    visualized and interpreted imaging which showed *** Additional history obtained from *** Previous records obtained and reviewed *** I consulted *** and discussed lab and imaging findings and discussed disposition.  Cardiac monitoring reviewed, *** Social determinants considered, *** Critical Interventions: ***  After the interventions stated above, I reevaluated the patient and found *** Admission and further testing considered, ***   {Document critical care time when appropriate:1} {Document review of labs and clinical decision tools ie heart score, Chads2Vasc2 etc:1}  {Document your independent review of radiology images, and any outside records:1} {Document your discussion with family members, caretakers, and with consultants:1} {Document social determinants of health affecting pt's care:1} {Document your decision making why or why not admission, treatments were needed:1} Final Clinical Impression(s) / ED Diagnoses Final diagnoses:   None    Rx / DC Orders ED Discharge Orders     None

## 2023-01-20 NOTE — ED Notes (Signed)
Patient verbalizes understanding of discharge instructions, unable to stay for further evaluation. Opportunity for questioning and answers were provided. Armband removed by staff, pt discharged from ED. Ambulated out to lobby

## 2023-01-21 LAB — HEPATITIS PANEL, ACUTE
HCV Ab: NONREACTIVE
Hep A IgM: NONREACTIVE
Hep B C IgM: NONREACTIVE
Hepatitis B Surface Ag: NONREACTIVE

## 2023-02-09 ENCOUNTER — Emergency Department (HOSPITAL_COMMUNITY): Payer: Commercial Managed Care - PPO

## 2023-02-09 ENCOUNTER — Inpatient Hospital Stay (HOSPITAL_COMMUNITY)
Admission: EM | Admit: 2023-02-09 | Discharge: 2023-02-11 | DRG: 433 | Disposition: A | Discharge status: Home / Self Care | Payer: Commercial Managed Care - PPO | Attending: Internal Medicine | Admitting: Internal Medicine

## 2023-02-09 ENCOUNTER — Other Ambulatory Visit: Payer: Self-pay

## 2023-02-09 DIAGNOSIS — R7989 Other specified abnormal findings of blood chemistry: Secondary | ICD-10-CM | POA: Diagnosis present

## 2023-02-09 DIAGNOSIS — R140 Abdominal distension (gaseous): Principal | ICD-10-CM

## 2023-02-09 DIAGNOSIS — K760 Fatty (change of) liver, not elsewhere classified: Secondary | ICD-10-CM | POA: Diagnosis present

## 2023-02-09 DIAGNOSIS — R109 Unspecified abdominal pain: Secondary | ICD-10-CM | POA: Diagnosis present

## 2023-02-09 DIAGNOSIS — F32A Depression, unspecified: Secondary | ICD-10-CM | POA: Diagnosis present

## 2023-02-09 DIAGNOSIS — F17210 Nicotine dependence, cigarettes, uncomplicated: Secondary | ICD-10-CM | POA: Diagnosis present

## 2023-02-09 DIAGNOSIS — Y905 Blood alcohol level of 100-119 mg/100 ml: Secondary | ICD-10-CM | POA: Diagnosis present

## 2023-02-09 DIAGNOSIS — K7031 Alcoholic cirrhosis of liver with ascites: Principal | ICD-10-CM | POA: Diagnosis present

## 2023-02-09 DIAGNOSIS — F411 Generalized anxiety disorder: Secondary | ICD-10-CM | POA: Diagnosis present

## 2023-02-09 DIAGNOSIS — F419 Anxiety disorder, unspecified: Secondary | ICD-10-CM | POA: Diagnosis present

## 2023-02-09 DIAGNOSIS — F329 Major depressive disorder, single episode, unspecified: Secondary | ICD-10-CM | POA: Diagnosis present

## 2023-02-09 DIAGNOSIS — F1010 Alcohol abuse, uncomplicated: Secondary | ICD-10-CM

## 2023-02-09 DIAGNOSIS — Z79899 Other long term (current) drug therapy: Secondary | ICD-10-CM

## 2023-02-09 DIAGNOSIS — K5900 Constipation, unspecified: Secondary | ICD-10-CM | POA: Diagnosis present

## 2023-02-09 DIAGNOSIS — E8809 Other disorders of plasma-protein metabolism, not elsewhere classified: Secondary | ICD-10-CM | POA: Diagnosis present

## 2023-02-09 DIAGNOSIS — D72828 Other elevated white blood cell count: Secondary | ICD-10-CM | POA: Diagnosis present

## 2023-02-09 DIAGNOSIS — E8770 Fluid overload, unspecified: Secondary | ICD-10-CM | POA: Diagnosis present

## 2023-02-09 DIAGNOSIS — E871 Hypo-osmolality and hyponatremia: Secondary | ICD-10-CM | POA: Diagnosis present

## 2023-02-09 DIAGNOSIS — R7401 Elevation of levels of liver transaminase levels: Secondary | ICD-10-CM

## 2023-02-09 DIAGNOSIS — R188 Other ascites: Secondary | ICD-10-CM | POA: Diagnosis present

## 2023-02-09 LAB — COMPREHENSIVE METABOLIC PANEL
ALT: 57 U/L — ABNORMAL HIGH (ref 0–44)
AST: 148 U/L — ABNORMAL HIGH (ref 15–41)
Albumin: 3.4 g/dL — ABNORMAL LOW (ref 3.5–5.0)
Alkaline Phosphatase: 135 U/L — ABNORMAL HIGH (ref 38–126)
Anion gap: 8 (ref 5–15)
BUN: 6 mg/dL (ref 6–20)
CO2: 25 mmol/L (ref 22–32)
Calcium: 8.2 mg/dL — ABNORMAL LOW (ref 8.9–10.3)
Chloride: 99 mmol/L (ref 98–111)
Creatinine, Ser: 0.61 mg/dL (ref 0.61–1.24)
GFR, Estimated: >60 mL/min (ref >60)
Glucose, Bld: 106 mg/dL — ABNORMAL HIGH (ref 70–99)
Potassium: 3.7 mmol/L (ref 3.5–5.1)
Sodium: 132 mmol/L — ABNORMAL LOW (ref 135–145)
Total Bilirubin: 0.8 mg/dL (ref 0.3–1.2)
Total Protein: 7.5 g/dL (ref 6.5–8.1)

## 2023-02-09 LAB — CBC WITH DIFFERENTIAL/PLATELET
Abs Immature Granulocytes: 0.06 10*3/uL (ref 0.00–0.07)
Basophils Absolute: 0.1 10*3/uL (ref 0.0–0.1)
Basophils Relative: 1 %
Eosinophils Absolute: 0.1 10*3/uL (ref 0.0–0.5)
Eosinophils Relative: 1 %
HCT: 44.3 % (ref 39.0–52.0)
Hemoglobin: 14.5 g/dL (ref 13.0–17.0)
Immature Granulocytes: 1 %
Lymphocytes Relative: 13 %
Lymphs Abs: 1.5 10*3/uL (ref 0.7–4.0)
MCH: 33.6 pg (ref 26.0–34.0)
MCHC: 32.7 g/dL (ref 30.0–36.0)
MCV: 102.8 fL — ABNORMAL HIGH (ref 80.0–100.0)
Monocytes Absolute: 0.8 10*3/uL (ref 0.1–1.0)
Monocytes Relative: 7 %
Neutro Abs: 9.2 10*3/uL — ABNORMAL HIGH (ref 1.7–7.7)
Neutrophils Relative %: 77 %
Platelets: 165 10*3/uL (ref 150–400)
RBC: 4.31 MIL/uL (ref 4.22–5.81)
RDW: 12.6 % (ref 11.5–15.5)
WBC: 11.6 10*3/uL — ABNORMAL HIGH (ref 4.0–10.5)
nRBC: 0 % (ref 0.0–0.2)

## 2023-02-09 LAB — ETHANOL: Alcohol, Ethyl (B): 114 mg/dL — ABNORMAL HIGH (ref <10)

## 2023-02-09 LAB — LIPASE, BLOOD: Lipase: 45 U/L (ref 11–51)

## 2023-02-09 LAB — PROTIME-INR
INR: 1.1 (ref 0.8–1.2)
Prothrombin Time: 14.1 seconds (ref 11.4–15.2)

## 2023-02-09 LAB — BRAIN NATRIURETIC PEPTIDE: B Natriuretic Peptide: 50.7 pg/mL (ref 0.0–100.0)

## 2023-02-09 LAB — AMMONIA: Ammonia: <10 umol/L (ref 9–35)

## 2023-02-09 LAB — D-DIMER, QUANTITATIVE: D-Dimer, Quant: 17.16 ug/mL-FEU — ABNORMAL HIGH (ref 0.00–0.50)

## 2023-02-09 MED ORDER — ENOXAPARIN SODIUM 40 MG/0.4ML IJ SOSY
40.0000 mg | PREFILLED_SYRINGE | Freq: Every day | INTRAMUSCULAR | Status: DC
  Administered 2023-02-09 – 2023-02-10 (×2): 40 mg via SUBCUTANEOUS
  Filled 2023-02-09 (×2): qty 0.4

## 2023-02-09 MED ORDER — ADULT MULTIVITAMIN W/MINERALS CH
1.0000 | ORAL_TABLET | Freq: Every day | ORAL | Status: DC
  Administered 2023-02-10 – 2023-02-11 (×2): 1 via ORAL
  Filled 2023-02-09 (×2): qty 1

## 2023-02-09 MED ORDER — THIAMINE HCL 100 MG/ML IJ SOLN
100.0000 mg | Freq: Every day | INTRAMUSCULAR | Status: DC
  Filled 2023-02-09: qty 2

## 2023-02-09 MED ORDER — SERTRALINE HCL 50 MG PO TABS
50.0000 mg | ORAL_TABLET | Freq: Every day | ORAL | Status: DC
  Administered 2023-02-10 – 2023-02-11 (×2): 50 mg via ORAL
  Filled 2023-02-09 (×2): qty 1

## 2023-02-09 MED ORDER — POLYETHYLENE GLYCOL 3350 17 G PO PACK: 17.0000 g | PACK | Freq: Every day | ORAL | Status: DC | PRN

## 2023-02-09 MED ORDER — FUROSEMIDE 10 MG/ML IJ SOLN
40.0000 mg | Freq: Once | INTRAMUSCULAR | Status: AC
  Administered 2023-02-09: 40 mg via INTRAVENOUS
  Filled 2023-02-09: qty 4

## 2023-02-09 MED ORDER — NICOTINE 7 MG/24HR TD PT24
7.0000 mg | MEDICATED_PATCH | Freq: Every day | TRANSDERMAL | Status: AC
  Administered 2023-02-09: 7 mg via TRANSDERMAL
  Filled 2023-02-09: qty 1

## 2023-02-09 MED ORDER — THIAMINE MONONITRATE 100 MG PO TABS
100.0000 mg | ORAL_TABLET | Freq: Every day | ORAL | Status: DC
  Administered 2023-02-10 – 2023-02-11 (×2): 100 mg via ORAL
  Filled 2023-02-09 (×2): qty 1

## 2023-02-09 MED ORDER — MELATONIN 5 MG PO TABS
5.0000 mg | ORAL_TABLET | Freq: Every evening | ORAL | Status: AC | PRN
  Administered 2023-02-09 – 2023-02-10 (×2): 5 mg via ORAL
  Filled 2023-02-09 (×2): qty 1

## 2023-02-09 MED ORDER — PROCHLORPERAZINE EDISYLATE 10 MG/2ML IJ SOLN: 5.0000 mg | Freq: Four times a day (QID) | INTRAMUSCULAR | Status: DC | PRN

## 2023-02-09 MED ORDER — FOLIC ACID 1 MG PO TABS
1.0000 mg | ORAL_TABLET | Freq: Every day | ORAL | Status: DC
  Administered 2023-02-10 – 2023-02-11 (×2): 1 mg via ORAL
  Filled 2023-02-09 (×2): qty 1

## 2023-02-09 MED ORDER — QUETIAPINE FUMARATE 25 MG PO TABS: 50.0000 mg | ORAL_TABLET | Freq: Every day | ORAL | Status: DC

## 2023-02-09 MED ORDER — LORAZEPAM 2 MG/ML IJ SOLN: 1.0000 mg | INTRAMUSCULAR | Status: DC | PRN

## 2023-02-09 MED ORDER — LORAZEPAM 1 MG PO TABS
1.0000 mg | ORAL_TABLET | ORAL | Status: DC | PRN
  Administered 2023-02-09 – 2023-02-11 (×6): 2 mg via ORAL
  Filled 2023-02-09 (×6): qty 2

## 2023-02-09 MED ORDER — ALBUMIN HUMAN 25 % IV SOLN
25.0000 g | Freq: Four times a day (QID) | INTRAVENOUS | Status: AC
  Administered 2023-02-09 – 2023-02-10 (×3): 25 g via INTRAVENOUS
  Filled 2023-02-09 (×3): qty 100

## 2023-02-09 MED ORDER — IOHEXOL 300 MG/ML  SOLN
100.0000 mL | Freq: Once | INTRAMUSCULAR | Status: AC | PRN
  Administered 2023-02-09: 100 mL via INTRAVENOUS

## 2023-02-09 NOTE — ED Triage Notes (Signed)
C/o abd pain with swelling and tender to palpatation Also c/o bilateral lower extremity swelling, was started on lasix and potassium w/o relief.  Pt reports leaving AMA on 3/27 due to needing to go to work.  Recently diagnosed with cirrhosis of liver.  Pt reports 2 beers PTA today.

## 2023-02-09 NOTE — ED Notes (Signed)
ED TO INPATIENT HANDOFF REPORT  ED Nurse Name and Phone #: Julian Reil [phone]  S Name/Age/Gender Alfred Hicks 42 y.o. male Room/Bed: WA20/WA20  Code Status   Code Status: Not on file  Home/SNF/Other Home Patient oriented to: self, place, time, and situation Is this baseline? Yes   Triage Complete: Triage complete  Chief Complaint Abdominal pain [R10.9]  Triage Note C/o abd pain with swelling and tender to palpatation Also c/o bilateral lower extremity swelling, was started on lasix and potassium w/o relief.  Pt reports leaving AMA on 3/27 due to needing to go to work.  Recently diagnosed with cirrhosis of liver.  Pt reports 2 beers PTA today.    Allergies No Known Allergies  Level of Care/Admitting Diagnosis ED Disposition     ED Disposition  Admit   Condition  --   Comment  Hospital Area: [facility] [[id number]]  Level of Care: Telemetry [5]  Admit to tele based on following criteria: Monitor for Ischemic changes  May place patient in observation at [facility] or [facility] if equivalent level of care is available:: No  Covid Evaluation: Asymptomatic - no recent exposure (last 10 days) testing not required  Diagnosis: Abdominal pain [454098]  Admitting Physician: Darlin Drop [[id number]]  Attending Physician: Darlin Drop [[id number]]          B Medical/Surgery History Past Medical History:  Diagnosis Date   Anxiety    Depression    Mental disorder    Past Surgical History:  Procedure Laterality Date   TOOTH EXTRACTION  2000     A IV Location/Drains/Wounds Patient Lines/Drains/Airways Status     Active Line/Drains/Airways     Name Placement date Placement time Site Days   Peripheral IV 02/09/23 20 G Right Antecubital 02/09/23  1403  Antecubital  less than 1            Intake/Output Last 24 hours No intake or output data in the 24 hours ending 02/09/23 1946  Labs/Imaging Results for orders placed or  performed during the hospital encounter of 02/09/23 (from the past 48 hour(s))  Ethanol     Status: Abnormal   Collection Time: 02/09/23  1:43 PM  Result Value Ref Range   Alcohol, Ethyl (B) 114 (H) <10 mg/dL    Comment: (NOTE) Lowest detectable limit for serum alcohol is 10 mg/dL.  For medical purposes only. Performed at [facility], [address] [place], [place] [address]   CBC with Differential     Status: Abnormal   Collection Time: 02/09/23  2:01 PM  Result Value Ref Range   WBC 11.6 (H) 4.0 - 10.5 K/uL   RBC 4.31 4.22 - 5.81 MIL/uL   Hemoglobin 14.5 13.0 - 17.0 g/dL   HCT 84.6 96.2 - 95.2 %   MCV 102.8 (H) 80.0 - 100.0 fL   MCH 33.6 26.0 - 34.0 pg   MCHC 32.7 30.0 - 36.0 g/dL   RDW 84.1 32.4 - 40.1 %   Platelets 165 150 - 400 K/uL   nRBC 0.0 0.0 - 0.2 %   Neutrophils Relative % 77 %   Neutro Abs 9.2 (H) 1.7 - 7.7 K/uL   Lymphocytes Relative 13 %   Lymphs Abs 1.5 0.7 - 4.0 K/uL   Monocytes Relative 7 %   Monocytes Absolute 0.8 0.1 - 1.0 K/uL   Eosinophils Relative 1 %   Eosinophils Absolute 0.1 0.0 - 0.5 K/uL   Basophils Relative 1 %  Basophils Absolute 0.1 0.0 - 0.1 K/uL   Immature Granulocytes 1 %   Abs Immature Granulocytes 0.06 0.00 - 0.07 K/uL    Comment: Performed at [facility], [address] [place], [place] [address]  Comprehensive metabolic panel     Status: Abnormal   Collection Time: 02/09/23  2:01 PM  Result Value Ref Range   Sodium 132 (L) 135 - 145 mmol/L   Potassium 3.7 3.5 - 5.1 mmol/L   Chloride 99 98 - 111 mmol/L   CO2 25 22 - 32 mmol/L   Glucose, Bld 106 (H) 70 - 99 mg/dL    Comment: Glucose reference range applies only to samples taken after fasting for at least 8 hours.   BUN 6 6 - 20 mg/dL   Creatinine, Ser 6.04 0.61 - 1.24 mg/dL   Calcium 8.2 (L) 8.9 - 10.3 mg/dL   Total Protein 7.5 6.5 - 8.1 g/dL   Albumin 3.4 (L) 3.5 - 5.0 g/dL   AST 540 (H) 15 - 41 U/L   ALT 57 (H) 0 - 44 U/L   Alkaline  Phosphatase 135 (H) 38 - 126 U/L   Total Bilirubin 0.8 0.3 - 1.2 mg/dL   GFR, Estimated >98 >11 mL/min    Comment: (NOTE) Calculated using the CKD-EPI Creatinine Equation (2021)    Anion gap 8 5 - 15    Comment: Performed at [facility], [address], [place], [place] [address]  Lipase, blood     Status: None   Collection Time: 02/09/23  2:01 PM  Result Value Ref Range   Lipase 45 11 - 51 U/L    Comment: Performed at [facility], [address] [place], [place] [address]  Ammonia     Status: None   Collection Time: 02/09/23  2:01 PM  Result Value Ref Range   Ammonia <10 9 - 35 umol/L    Comment: Performed at [facility], [address], [place], [place] [address]  Brain natriuretic peptide     Status: None   Collection Time: 02/09/23  2:01 PM  Result Value Ref Range   B Natriuretic Peptide 50.7 0.0 - 100.0 pg/mL    Comment: Performed at [facility], [address], [place], [place] [address]   CT ABDOMEN PELVIS W CONTRAST  Result Date: 02/09/2023 CLINICAL DATA:  Generalized abdominal pain. EXAM: CT ABDOMEN AND PELVIS WITH CONTRAST TECHNIQUE: Multidetector CT imaging of the abdomen and pelvis was performed using the standard protocol following bolus administration of intravenous contrast. RADIATION DOSE REDUCTION: This exam was performed according to the departmental dose-optimization program which includes automated exposure control, adjustment of the mA and/or kV according to patient size and/or use of iterative reconstruction technique. CONTRAST:  OMNIPAQUE IOHEXOL 300 MG/ML  SOLN COMPARISON:  January 20, 2023. FINDINGS: Lower chest: Small right pleural effusion is noted with minimal adjacent subsegmental atelectasis. Hepatobiliary: No cholelithiasis or biliary dilatation is noted. Hepatic steatosis is noted. Pancreas: Unremarkable. No pancreatic ductal dilatation or surrounding inflammatory changes.  Spleen: Normal in size without focal abnormality. Adrenals/Urinary Tract: Adrenal glands are unremarkable. Kidneys are normal, without renal calculi, focal lesion, or hydronephrosis. Bladder is unremarkable. Stomach/Bowel: Stomach is within normal limits. Appendix appears normal. No evidence of bowel wall thickening, distention, or inflammatory changes. Vascular/Lymphatic: Aortic atherosclerosis. Mildly enlarged periaortic and portacaval lymph nodes are noted which most likely are inflammatory or reactive in etiology. Reproductive: Prostate is unremarkable. Other: Mild ascites is noted  in the pelvis and around the liver and spleen. Musculoskeletal: No acute or significant osseous findings. IMPRESSION: Small right pleural effusion with minimal adjacent subsegmental atelectasis. Hepatic steatosis. Mild ascites. Mildly enlarged periaortic and portacaval lymph nodes are noted which most likely are inflammatory or reactive in etiology. Aortic Atherosclerosis (ICD10-I70.0). Electronically Signed   By: Lupita Raider M.D.   On: 02/09/2023 16:06   DG Chest Portable 1 View  Result Date: 02/09/2023 CLINICAL DATA:  Edema. EXAM: PORTABLE CHEST 1 VIEW COMPARISON:  CT abdomen 01/20/2023 and chest radiograph 01/20/2023 FINDINGS: Single-view of the chest demonstrates slightly decreased lung volumes. Evidence for a small right pleural effusion. No overt pulmonary edema. Heart size is within normal limits and stable. The trachea is midline. Negative for a pneumothorax. No acute bone abnormality. IMPRESSION: 1. Low lung volumes with small right pleural effusion. Findings are similar to previous examination. Electronically Signed   By: Richarda Overlie M.D.   On: 02/09/2023 14:10    Pending Labs Unresulted Labs (From admission, onward)     Start     Ordered   02/09/23 1915  Protime-INR  Once,   R        02/09/23 1914   02/09/23 1342  Urinalysis, Routine w reflex microscopic -Urine, Clean Catch  Once,   URGENT       Question:   Specimen Source  Answer:  Urine, Clean Catch   02/09/23 1343            Vitals/Pain Today's Vitals   02/09/23 1306 02/09/23 1318 02/09/23 1700  BP: (!) 140/93  (!) 122/95  Pulse: (!) 101  94  Resp: 16  17  Temp: 97.8 F (36.6 C)  97.8 F (36.6 C)  TempSrc: Oral    SpO2: 96%  95%  PainSc:  5      Isolation Precautions No active isolations  Medications Medications  iohexol (OMNIPAQUE) 300 MG/ML solution 100 mL (100 mLs Intravenous Contrast Given 02/09/23 1513)    Mobility walks     Focused Assessments    R Recommendations: See Admitting Provider Note  Report given to:   Additional Notes:

## 2023-02-09 NOTE — ED Notes (Signed)
Hospitalist at bedside 

## 2023-02-09 NOTE — ED Notes (Signed)
Pt refused nasal canula. Pt would stat at 83%-85%.

## 2023-02-09 NOTE — H&P (Addendum)
History and Physical  Alfred Hicks:[id number] DOB: [birth year] DOA: 02/09/2023  Referring physician: Tiffany Kocher  PCP: Raynelle Bring [facility]  Outpatient Specialists: Behavioral health. Patient coming from: Home.  Chief Complaint: Abdominal distention, discomfort, and swelling in the legs.  HPI: Alfred Hicks is a 42 y.o. male with medical history significant for alcohol abuse, alcoholic hepatic steatosis, major depressive disorder, generalized anxiety disorder, recently seen in the ED on 01/20/2023 but left AMA prior to being admitted.  The patient presents with abdominal distention, discomfort and bilateral lower extremity edema.  Symptoms have been ongoing for the past 2 to 3 weeks and have been gradually worsening.  Last alcohol intake, drink 2 beers prior to presenting to the ED this morning.  He became concerned about his swelling, so presented to the ED for further evaluation.  Denies subjective fevers or chills.  Admits to mild nausea but no vomiting.  Denies overt bleeding, no hematuria, hematemesis, coffee-ground emesis, hematochezia, or melena.  In the ED, volume overload on exam.  Blood pressure and heart rate are elevated.  CT abdomen pelvis with contrast revealed the following:  Small right pleural effusion with minimal adjacent subsegmental atelectasis.  Hepatic steatosis.  Mild ascites.  Mildly enlarged periaortic and portacaval lymph nodes are noted which most likely are inflammatory or reactive in etiology.  Aortic Atherosclerosis (ICD10-I70.0).  EDP discussed the case with GI Dr. Bosie Clos, GI team will see in consultation.  The patient was admitted by [facility], hospitalist service.  ED Course: BP 138/95, pulse 94, respiratory 17, O2 saturation 97% on room air.  Lab studies remarkable for alcohol level 114. Lab studies markable for WBC 11.6, hemoglobin 14.5, MCV 102.8, neutrophil count 9.2.  Serum sodium 132, glucose 106, alkaline phosphatase 135, albumin  3.4, AST 148, ALT 57, total bilirubin 0.8.  BNP 50.7.  INR and UA are pending.  Review of Systems: Review of systems as noted in the HPI. All other systems reviewed and are negative.   Past Medical History:  Diagnosis Date   Anxiety    Depression    Mental disorder    Past Surgical History:  Procedure Laterality Date   TOOTH EXTRACTION  2000    Social History:  reports that he has been smoking cigarettes. He has a 22.50 pack-year smoking history. He has never used smokeless tobacco. He reports current alcohol use. He reports that he does not use drugs.   No Known Allergies  Family history: None reported.  Prior to Admission medications   Medication Sig Start Date End Date Taking? Authorizing Provider  acetaminophen (TYLENOL) 500 MG tablet Take 500 mg by mouth every 6 (six) hours as needed (For stress headaches.).    [provider]  buPROPion (WELLBUTRIN XL) 150 MG 24 hr tablet TAKE 3 TABLETS BY MOUTH EVERY MORNING 09/04/22   Mozingo, Thereasa Solo, NP  clonazePAM (KLONOPIN) 1 MG tablet Take 1 tablet (1 mg total) by mouth 3 (three) times daily as needed for anxiety. 12/10/22   Mozingo, Thereasa Solo, NP  furosemide (LASIX) 20 MG tablet Take 1 tablet (20 mg total) by mouth daily. 01/20/23   Terrilee Files, MD  loperamide (IMODIUM) 2 MG capsule Take 2 mg by mouth as needed for diarrhea or loose stools.    [provider]  metoCLOPramide (REGLAN) 10 MG tablet Take 1 tablet (10 mg total) by mouth every 6 (six) hours as needed for nausea. 12/28/13   Elpidio Anis, PA-C  potassium chloride (KLOR-CON) 10 MEQ  tablet Take 1 tablet (10 mEq total) by mouth daily. 01/20/23   Terrilee Files, MD  QUEtiapine (SEROQUEL) 50 MG tablet Take 1-2 tablets (50-100 mg total) by mouth at bedtime. 12/21/22   Mozingo, Thereasa Solo, NP  sertraline (ZOLOFT) 50 MG tablet Take 1 tablet (50 mg total) by mouth daily. 09/04/22   Mozingo, Thereasa Solo, NP  traZODone (DESYREL) 100 MG  tablet Take 1 tablet (100 mg total) by mouth at bedtime. 09/04/22   Mozingo, Thereasa Solo, NP    Physical Exam: BP (!) 138/95 (BP Location: Left Arm)   Pulse 94   Temp 98.6 F (37 C) (Oral)   Resp 17   SpO2 97%   General: 42 y.o. year-old male well developed well nourished in no acute distress.  Alert and oriented x3. Cardiovascular: Tachycardia cardiac with no rubs or gallops.  No thyromegaly or JVD noted.  3+ pitting edema in lower extremities bilaterally.  Respiratory: Clear to auscultation with no wheezes or rales. Good inspiratory effort. Abdomen: Distended with bowel sounds present.   Muskuloskeletal: No cyanosis or clubbing noted bilaterally Neuro: CN II-XII intact, strength, sensation, reflexes Skin: No ulcerative lesions noted or rashes Psychiatry: Judgement and insight appear normal. Mood is appropriate for condition and setting          Labs on Admission:  Basic Metabolic Panel: Recent Labs  Lab 02/09/23 1401  NA 132*  K 3.7  CL 99  CO2 25  GLUCOSE 106*  BUN 6  CREATININE 0.61  CALCIUM 8.2*   Liver Function Tests: Recent Labs  Lab 02/09/23 1401  AST 148*  ALT 57*  ALKPHOS 135*  BILITOT 0.8  PROT 7.5  ALBUMIN 3.4*   Recent Labs  Lab 02/09/23 1401  LIPASE 45   Recent Labs  Lab 02/09/23 1401  AMMONIA <10   CBC: Recent Labs  Lab 02/09/23 1401  WBC 11.6*  NEUTROABS 9.2*  HGB 14.5  HCT 44.3  MCV 102.8*  PLT 165   Cardiac Enzymes: No results for input(s): "CKTOTAL", "CKMB", "CKMBINDEX", "TROPONINI" in the last 168 hours.  BNP (last 3 results) Recent Labs    02/09/23 1401  BNP 50.7    ProBNP (last 3 results) No results for input(s): "PROBNP" in the last 8760 hours.  CBG: No results for input(s): "GLUCAP" in the last 168 hours.  Radiological Exams on Admission: CT ABDOMEN PELVIS W CONTRAST  Result Date: 02/09/2023 CLINICAL DATA:  Generalized abdominal pain. EXAM: CT ABDOMEN AND PELVIS WITH CONTRAST TECHNIQUE: Multidetector CT  imaging of the abdomen and pelvis was performed using the standard protocol following bolus administration of intravenous contrast. RADIATION DOSE REDUCTION: This exam was performed according to the departmental dose-optimization program which includes automated exposure control, adjustment of the mA and/or kV according to patient size and/or use of iterative reconstruction technique. CONTRAST:  OMNIPAQUE IOHEXOL 300 MG/ML  SOLN COMPARISON:  January 20, 2023. FINDINGS: Lower chest: Small right pleural effusion is noted with minimal adjacent subsegmental atelectasis. Hepatobiliary: No cholelithiasis or biliary dilatation is noted. Hepatic steatosis is noted. Pancreas: Unremarkable. No pancreatic ductal dilatation or surrounding inflammatory changes. Spleen: Normal in size without focal abnormality. Adrenals/Urinary Tract: Adrenal glands are unremarkable. Kidneys are normal, without renal calculi, focal lesion, or hydronephrosis. Bladder is unremarkable. Stomach/Bowel: Stomach is within normal limits. Appendix appears normal. No evidence of bowel wall thickening, distention, or inflammatory changes. Vascular/Lymphatic: Aortic atherosclerosis. Mildly enlarged periaortic and portacaval lymph nodes are noted which most likely are inflammatory or reactive in etiology. Reproductive:  Prostate is unremarkable. Other: Mild ascites is noted in the pelvis and around the liver and spleen. Musculoskeletal: No acute or significant osseous findings. IMPRESSION: Small right pleural effusion with minimal adjacent subsegmental atelectasis. Hepatic steatosis. Mild ascites. Mildly enlarged periaortic and portacaval lymph nodes are noted which most likely are inflammatory or reactive in etiology. Aortic Atherosclerosis (ICD10-I70.0). Electronically Signed   By: Lupita Raider M.D.   On: 02/09/2023 16:06   DG Chest Portable 1 View  Result Date: 02/09/2023 CLINICAL DATA:  Edema. EXAM: PORTABLE CHEST 1 VIEW COMPARISON:  CT abdomen  01/20/2023 and chest radiograph 01/20/2023 FINDINGS: Single-view of the chest demonstrates slightly decreased lung volumes. Evidence for a small right pleural effusion. No overt pulmonary edema. Heart size is within normal limits and stable. The trachea is midline. Negative for a pneumothorax. No acute bone abnormality. IMPRESSION: 1. Low lung volumes with small right pleural effusion. Findings are similar to previous examination. Electronically Signed   By: Richarda Overlie M.D.   On: 02/09/2023 14:10    EKG: I independently viewed the EKG done and my findings are as followed: Sinus rhythm rate of 89.  Nonspecific ST-T changes.  QTc.  479.   Assessment/Plan Present on Admission:  Abdominal pain  Principal Problem:   Abdominal pain  Abdominal distention and pain, rule out intra-abdominal infection. No reported GI bleed or any other overt bleeding Hepatic steatosis seen on CT scan abdomen and pelvis with contrast Suspect distention and discomfort are, secondary to ascites Rule out intra-abdominal infection, IR consulted for possible paracentesis on 02/10/23. Paracentesis for both diagnostic and therapeutic purposes. No reported subjective fevers or chills. Monitor fever curve and WBC, repeat CBC in the morning.  Alcohol abuse with concern for withdrawal Last alcohol intake was this morning Alcohol level 114. CIWA protocol in place Start multivitamins, folic acid and thiamine supplements. Alcohol cessation counseling done at bedside.  Alcoholic hepatic steatosis with ascites INR is pending. Last INR 1.2 on 01/20/2023 IV albumin 25 g every 6 hours x 3 IV Lasix 40 mg x 1 Avoid hypotension and maintain MAP greater than 65.  Elevated liver chemistries Alkaline phosphatase, AST and ALT elevated. T. bili within normal limit. Avoid hepatotoxic agents Counseled on the importance of alcohol cessation.  Hypoalbuminemia and edema Hypoalbuminemia in the setting of liver steatosis Serum albumin  3.4 Bilateral lower extremity pitting edema, elevate lower extremities  Bilateral lower extremity edema Small right pleural effusion. BNP 50 Suspect secondary to hypoalbuminemia Obtain D-dimer, if elevated rule out bilateral lower extremity DVT  Hypervolemic hyponatremia Serum sodium 132 IV Lasix 40 mg x 1 Repeat chemistry panel in the morning.  Chronic anxiety/depression Resume home regimen   DVT prophylaxis: Subcu Lovenox daily  Code Status: Full code  Family Communication: None at bedside.  Disposition Plan: Admitted to telemetry unit  Consults called: GI.  Admission status: Observation status.   Status is: Observation    Darlin Drop MD [facility] Pager [phone]  If 7PM-7AM, please contact night-coverage [email or web address] Password [email or web address]  02/09/2023, 9:00 PM

## 2023-02-09 NOTE — ED Provider Notes (Signed)
[name] EMERGENCY DEPARTMENT AT [facility] Provider Note   CSN: [id number] Arrival date & time: 02/09/23  1257     History  Chief Complaint  Patient presents with   Abdominal Pain   Leg Swelling    Alfred Hicks is a 42 y.o. male with a past medical history significant for anxiety, depression, and alcohol abuse who presents to the ED due to abdominal distention and lower extremity edema for the past 3 weeks.  Patient seen in the ED on 3/27 for the same complaint where his symptoms were thought to be related to alcoholic cirrhosis. It was recommended for patient to be admitted to the hospital after discussion with GI however, patient left AGAINST MEDICAL ADVICE.  Patient states his symptoms never improved.  He notes his abdominal distention has worsened.  After leaving the ED on 3/27 patient stopped drinking alcohol.  Prior to 3/27 he was drinking 5-6 beers daily which was decreased from his normal.  Patient then notes he drank 2 beers prior to arrival.  Denies history of complicated alcohol withdrawal.  No seizures since leaving the ED on 3/27.  Denies tremors, hallucinations, diaphoresis.  He admits to some shortness of breath which he describes as "his abdomen pressing on his lungs".  He has been taking Lasix with no improvement in his lower extremity edema.  No chest pain. No history of CHF. No history of blood clots. Denies fever. Admits to weekly NBNB emesis. Took a laxative over the weekend because he thought his abdominal distention was related to constipation with no improvement in his symptoms.   History obtained from patient and past medical records. No interpreter used during encounter.       Home Medications Prior to Admission medications   Medication Sig Start Date End Date Taking? Authorizing Provider  acetaminophen (TYLENOL) 500 MG tablet Take 500 mg by mouth every 6 (six) hours as needed (For stress headaches.).    [provider]  buPROPion  (WELLBUTRIN XL) 150 MG 24 hr tablet TAKE 3 TABLETS BY MOUTH EVERY MORNING 09/04/22   Mozingo, Thereasa Solo, NP  clonazePAM (KLONOPIN) 1 MG tablet Take 1 tablet (1 mg total) by mouth 3 (three) times daily as needed for anxiety. 12/10/22   Mozingo, Thereasa Solo, NP  furosemide (LASIX) 20 MG tablet Take 1 tablet (20 mg total) by mouth daily. 01/20/23   Terrilee Files, MD  loperamide (IMODIUM) 2 MG capsule Take 2 mg by mouth as needed for diarrhea or loose stools.    [provider]  metoCLOPramide (REGLAN) 10 MG tablet Take 1 tablet (10 mg total) by mouth every 6 (six) hours as needed for nausea. 12/28/13   Elpidio Anis, PA-C  potassium chloride (KLOR-CON) 10 MEQ tablet Take 1 tablet (10 mEq total) by mouth daily. 01/20/23   Terrilee Files, MD  QUEtiapine (SEROQUEL) 50 MG tablet Take 1-2 tablets (50-100 mg total) by mouth at bedtime. 12/21/22   Mozingo, Thereasa Solo, NP  sertraline (ZOLOFT) 50 MG tablet Take 1 tablet (50 mg total) by mouth daily. 09/04/22   Mozingo, Thereasa Solo, NP  traZODone (DESYREL) 100 MG tablet Take 1 tablet (100 mg total) by mouth at bedtime. 09/04/22   Mozingo, Thereasa Solo, NP      Allergies    Patient has no known allergies.    Review of Systems   Review of Systems  Constitutional:  Negative for chills and fever.  Respiratory:  Positive for shortness of breath.   Cardiovascular:  Negative for chest pain.  Gastrointestinal:  Positive for abdominal distention, abdominal pain, constipation and vomiting.  All other systems reviewed and are negative.   Physical Exam Updated Vital Signs BP (!) 122/95   Pulse 94   Temp 97.8 F (36.6 C)   Resp 17   SpO2 95%  Physical Exam Vitals and nursing note reviewed.  Constitutional:      General: He is not in acute distress.    Appearance: He is not ill-appearing.  HENT:     Head: Normocephalic.  Eyes:     Pupils: Pupils are equal, round, and reactive to light.  Cardiovascular:     Rate and  Rhythm: Regular rhythm. Tachycardia present.     Pulses: Normal pulses.     Heart sounds: Normal heart sounds. No murmur heard.    No friction rub. No gallop.  Pulmonary:     Effort: Pulmonary effort is normal.     Breath sounds: Normal breath sounds.  Abdominal:     General: Abdomen is flat. There is distension.     Palpations: Abdomen is soft.     Tenderness: There is abdominal tenderness. There is no guarding or rebound.  Musculoskeletal:        General: Normal range of motion.     Cervical back: Neck supple.     Comments: 2+ pitting edema bilaterally  Skin:    General: Skin is warm and dry.  Neurological:     General: No focal deficit present.     Mental Status: He is alert.  Psychiatric:        Mood and Affect: Mood normal.        Behavior: Behavior normal.     ED Results / Procedures / Treatments   Labs (all labs ordered are listed, but only abnormal results are displayed) Labs Reviewed  CBC WITH DIFFERENTIAL/PLATELET - Abnormal; Notable for the following components:      Result Value   WBC 11.6 (*)    MCV 102.8 (*)    Neutro Abs 9.2 (*)    All other components within normal limits  COMPREHENSIVE METABOLIC PANEL - Abnormal; Notable for the following components:   Sodium 132 (*)    Glucose, Bld 106 (*)    Calcium 8.2 (*)    Albumin 3.4 (*)    AST 148 (*)    ALT 57 (*)    Alkaline Phosphatase 135 (*)    All other components within normal limits  ETHANOL - Abnormal; Notable for the following components:   Alcohol, Ethyl (B) 114 (*)    All other components within normal limits  LIPASE, BLOOD  AMMONIA  BRAIN NATRIURETIC PEPTIDE  URINALYSIS, ROUTINE W REFLEX MICROSCOPIC  PROTIME-INR    EKG EKG Interpretation  Date/Time:  Tuesday February 09 2023 15:30:01 EDT Ventricular Rate:  89 PR Interval:  147 QRS Duration: 88 QT Interval:  393 QTC Calculation: 479 R Axis:   77 Text Interpretation: Sinus rhythm Borderline prolonged QT interval Confirmed by Vivi Barrack [phone]) on 02/09/2023 4:22:00 PM  Radiology CT ABDOMEN PELVIS W CONTRAST  Result Date: 02/09/2023 CLINICAL DATA:  Generalized abdominal pain. EXAM: CT ABDOMEN AND PELVIS WITH CONTRAST TECHNIQUE: Multidetector CT imaging of the abdomen and pelvis was performed using the standard protocol following bolus administration of intravenous contrast. RADIATION DOSE REDUCTION: This exam was performed according to the departmental dose-optimization program which includes automated exposure control, adjustment of the mA and/or kV according to patient size and/or use of iterative reconstruction technique.  CONTRAST:  OMNIPAQUE IOHEXOL 300 MG/ML  SOLN COMPARISON:  January 20, 2023. FINDINGS: Lower chest: Small right pleural effusion is noted with minimal adjacent subsegmental atelectasis. Hepatobiliary: No cholelithiasis or biliary dilatation is noted. Hepatic steatosis is noted. Pancreas: Unremarkable. No pancreatic ductal dilatation or surrounding inflammatory changes. Spleen: Normal in size without focal abnormality. Adrenals/Urinary Tract: Adrenal glands are unremarkable. Kidneys are normal, without renal calculi, focal lesion, or hydronephrosis. Bladder is unremarkable. Stomach/Bowel: Stomach is within normal limits. Appendix appears normal. No evidence of bowel wall thickening, distention, or inflammatory changes. Vascular/Lymphatic: Aortic atherosclerosis. Mildly enlarged periaortic and portacaval lymph nodes are noted which most likely are inflammatory or reactive in etiology. Reproductive: Prostate is unremarkable. Other: Mild ascites is noted in the pelvis and around the liver and spleen. Musculoskeletal: No acute or significant osseous findings. IMPRESSION: Small right pleural effusion with minimal adjacent subsegmental atelectasis. Hepatic steatosis. Mild ascites. Mildly enlarged periaortic and portacaval lymph nodes are noted which most likely are inflammatory or reactive in etiology. Aortic  Atherosclerosis (ICD10-I70.0). Electronically Signed   By: Lupita Raider M.D.   On: 02/09/2023 16:06   DG Chest Portable 1 View  Result Date: 02/09/2023 CLINICAL DATA:  Edema. EXAM: PORTABLE CHEST 1 VIEW COMPARISON:  CT abdomen 01/20/2023 and chest radiograph 01/20/2023 FINDINGS: Single-view of the chest demonstrates slightly decreased lung volumes. Evidence for a small right pleural effusion. No overt pulmonary edema. Heart size is within normal limits and stable. The trachea is midline. Negative for a pneumothorax. No acute bone abnormality. IMPRESSION: 1. Low lung volumes with small right pleural effusion. Findings are similar to previous examination. Electronically Signed   By: Richarda Overlie M.D.   On: 02/09/2023 14:10    Procedures Procedures    Medications Ordered in ED Medications  iohexol (OMNIPAQUE) 300 MG/ML solution 100 mL (100 mLs Intravenous Contrast Given 02/09/23 1513)    ED Course/ Medical Decision Making/ A&P Clinical Course as of 02/09/23 1914  Tue Feb 09, 2023  1652 Alcohol, Ethyl (B)(!): 114 [CA]    Clinical Course User Index [CA] Mannie Stabile, PA-C                             Medical Decision Making Amount and/or Complexity of Data Reviewed External Data Reviewed: notes. Labs: ordered. Decision-making details documented in ED Course. Radiology: ordered and independent interpretation performed. Decision-making details documented in ED Course. ECG/medicine tests: ordered and independent interpretation performed. Decision-making details documented in ED Course.  Risk Prescription drug management. Decision regarding hospitalization.   This patient presents to the ED for concern of abdominal distention, this involves an extensive number of treatment options, and is a complaint that carries with it a high risk of complications and morbidity.  The differential diagnosis includes cirrhosis, CHF, renal failure, infection, etc  42 year old male presents to the  ED due to abdominal distention and bilateral lower extremity edema.  Seen on 3/27 where his distention was thought to be related to alcoholic cirrhosis.  It was recommended for patient to be admitted after discussion with GI however, patient left AGAINST MEDICAL ADVICE.  Patient returns today due to worsening abdominal distention.  Admits to 1 episode of nonbloody, nonbilious emesis weekly.  Stopped drinking alcohol after leaving the ED on 3/27 however, had 2 beers prior to arrival.  No history of alcohol withdrawal.  No seizures, hallucinations, tremors, or diaphoresis.  Upon arrival, patient afebrile mildly tachycardic at 101 with otherwise reassuring  vitals.  Patient nontoxic-appearing.  Abdominal distention with diffuse tenderness.  2+ pitting edema bilaterally.  Lungs clear to auscultation bilaterally.  Labs ordered.  Ammonia.  CT abdomen to rule out bowel obstruction vs. Ascites vs. Other etiologies of distention.  Suspect edema likely related to cirrhosis. No bleeding.  CBC significant for mild leukocytosis 11.6.  Normal hemoglobin.  CMP significant for hyponatremia 132.  Transaminitis AST 148, ALT 57.  Normal lipase.  Ammonia normal.  BNP normal.  Low suspicion for CHF.  CT abdomen personally reviewed and interpreted which demonstrates a small right pleural effusion. Hepatic steatosis and mild ascitics.   Reassessed patient. Resting comfortably in bed. No signs of discomfort.  5:22 PM Discussed with Dr. Bosie Clos with GI who will see patient during his admission.  7:12 PM Discussed with Dr. Margo Aye with [facility] who agrees to admit patient.   Has PCP Hx alcohol abuse.         Final Clinical Impression(s) / ED Diagnoses Final diagnoses:  Abdominal distention  Transaminitis  Alcohol abuse    Rx / DC Orders ED Discharge Orders     None         Mannie Stabile, PA-C 02/09/23 1916    Loetta Rough, MD 02/10/23 [phone]

## 2023-02-10 ENCOUNTER — Inpatient Hospital Stay (HOSPITAL_COMMUNITY): Payer: Commercial Managed Care - PPO

## 2023-02-10 ENCOUNTER — Observation Stay (HOSPITAL_COMMUNITY): Payer: Commercial Managed Care - PPO

## 2023-02-10 ENCOUNTER — Encounter (HOSPITAL_COMMUNITY): Payer: Self-pay | Admitting: Anesthesiology

## 2023-02-10 ENCOUNTER — Encounter (HOSPITAL_COMMUNITY)
Admission: EM | Disposition: A | Discharge status: Home / Self Care | Payer: Self-pay | Source: Home / Self Care | Attending: Internal Medicine

## 2023-02-10 ENCOUNTER — Encounter (HOSPITAL_COMMUNITY): Payer: Self-pay | Admitting: Internal Medicine

## 2023-02-10 DIAGNOSIS — F32A Depression, unspecified: Secondary | ICD-10-CM | POA: Diagnosis present

## 2023-02-10 DIAGNOSIS — E8809 Other disorders of plasma-protein metabolism, not elsewhere classified: Secondary | ICD-10-CM | POA: Diagnosis present

## 2023-02-10 DIAGNOSIS — Y905 Blood alcohol level of 100-119 mg/100 ml: Secondary | ICD-10-CM | POA: Diagnosis present

## 2023-02-10 DIAGNOSIS — K760 Fatty (change of) liver, not elsewhere classified: Secondary | ICD-10-CM | POA: Diagnosis present

## 2023-02-10 DIAGNOSIS — R7989 Other specified abnormal findings of blood chemistry: Secondary | ICD-10-CM | POA: Diagnosis present

## 2023-02-10 DIAGNOSIS — R140 Abdominal distension (gaseous): Secondary | ICD-10-CM | POA: Diagnosis present

## 2023-02-10 DIAGNOSIS — F411 Generalized anxiety disorder: Secondary | ICD-10-CM | POA: Diagnosis present

## 2023-02-10 DIAGNOSIS — D72828 Other elevated white blood cell count: Secondary | ICD-10-CM | POA: Diagnosis present

## 2023-02-10 DIAGNOSIS — F329 Major depressive disorder, single episode, unspecified: Secondary | ICD-10-CM | POA: Diagnosis present

## 2023-02-10 DIAGNOSIS — R609 Edema, unspecified: Secondary | ICD-10-CM

## 2023-02-10 DIAGNOSIS — K7031 Alcoholic cirrhosis of liver with ascites: Secondary | ICD-10-CM | POA: Diagnosis present

## 2023-02-10 DIAGNOSIS — F419 Anxiety disorder, unspecified: Secondary | ICD-10-CM | POA: Diagnosis present

## 2023-02-10 DIAGNOSIS — E871 Hypo-osmolality and hyponatremia: Secondary | ICD-10-CM | POA: Diagnosis present

## 2023-02-10 DIAGNOSIS — F1010 Alcohol abuse, uncomplicated: Secondary | ICD-10-CM | POA: Diagnosis present

## 2023-02-10 DIAGNOSIS — E8770 Fluid overload, unspecified: Secondary | ICD-10-CM | POA: Diagnosis present

## 2023-02-10 DIAGNOSIS — K5900 Constipation, unspecified: Secondary | ICD-10-CM | POA: Diagnosis present

## 2023-02-10 DIAGNOSIS — R109 Unspecified abdominal pain: Secondary | ICD-10-CM | POA: Diagnosis not present

## 2023-02-10 DIAGNOSIS — F17210 Nicotine dependence, cigarettes, uncomplicated: Secondary | ICD-10-CM | POA: Diagnosis present

## 2023-02-10 DIAGNOSIS — R188 Other ascites: Secondary | ICD-10-CM | POA: Diagnosis present

## 2023-02-10 DIAGNOSIS — Z79899 Other long term (current) drug therapy: Secondary | ICD-10-CM | POA: Diagnosis not present

## 2023-02-10 LAB — COMPREHENSIVE METABOLIC PANEL
ALT: 41 U/L (ref 0–44)
AST: 94 U/L — ABNORMAL HIGH (ref 15–41)
Albumin: 3.4 g/dL — ABNORMAL LOW (ref 3.5–5.0)
Alkaline Phosphatase: 98 U/L (ref 38–126)
Anion gap: 10 (ref 5–15)
BUN: 9 mg/dL (ref 6–20)
CO2: 30 mmol/L (ref 22–32)
Calcium: 8.6 mg/dL — ABNORMAL LOW (ref 8.9–10.3)
Chloride: 95 mmol/L — ABNORMAL LOW (ref 98–111)
Creatinine, Ser: 0.64 mg/dL (ref 0.61–1.24)
GFR, Estimated: >60 mL/min (ref >60)
Glucose, Bld: 102 mg/dL — ABNORMAL HIGH (ref 70–99)
Potassium: 3.6 mmol/L (ref 3.5–5.1)
Sodium: 135 mmol/L (ref 135–145)
Total Bilirubin: 1.6 mg/dL — ABNORMAL HIGH (ref 0.3–1.2)
Total Protein: 6.9 g/dL (ref 6.5–8.1)

## 2023-02-10 LAB — CBC
HCT: 38.4 % — ABNORMAL LOW (ref 39.0–52.0)
Hemoglobin: 12.7 g/dL — ABNORMAL LOW (ref 13.0–17.0)
MCH: 34.2 pg — ABNORMAL HIGH (ref 26.0–34.0)
MCHC: 33.1 g/dL (ref 30.0–36.0)
MCV: 103.5 fL — ABNORMAL HIGH (ref 80.0–100.0)
Platelets: 137 10*3/uL — ABNORMAL LOW (ref 150–400)
RBC: 3.71 MIL/uL — ABNORMAL LOW (ref 4.22–5.81)
RDW: 12.7 % (ref 11.5–15.5)
WBC: 6.9 10*3/uL (ref 4.0–10.5)
nRBC: 0 % (ref 0.0–0.2)

## 2023-02-10 LAB — MAGNESIUM: Magnesium: 2.4 mg/dL (ref 1.7–2.4)

## 2023-02-10 LAB — HIV ANTIBODY (ROUTINE TESTING W REFLEX): HIV Screen 4th Generation wRfx: NONREACTIVE

## 2023-02-10 LAB — PHOSPHORUS: Phosphorus: 4.3 mg/dL (ref 2.5–4.6)

## 2023-02-10 SURGERY — ESOPHAGOGASTRODUODENOSCOPY (EGD) WITH PROPOFOL
Anesthesia: Monitor Anesthesia Care

## 2023-02-10 MED ORDER — FUROSEMIDE 40 MG PO TABS
40.0000 mg | ORAL_TABLET | Freq: Every day | ORAL | Status: DC
  Administered 2023-02-10 – 2023-02-11 (×2): 40 mg via ORAL
  Filled 2023-02-10 (×2): qty 1

## 2023-02-10 MED ORDER — LIDOCAINE HCL 1 % IJ SOLN
INTRAMUSCULAR | Status: AC
  Filled 2023-02-10: qty 20

## 2023-02-10 MED ORDER — IOHEXOL 350 MG/ML SOLN
100.0000 mL | Freq: Once | INTRAVENOUS | Status: AC | PRN
  Administered 2023-02-10: 100 mL via INTRAVENOUS

## 2023-02-10 MED ORDER — SODIUM CHLORIDE 0.9 % IV SOLN: INTRAVENOUS | Status: DC

## 2023-02-10 MED ORDER — SODIUM CHLORIDE (PF) 0.9 % IJ SOLN
INTRAMUSCULAR | Status: AC
  Filled 2023-02-10: qty 50

## 2023-02-10 MED ORDER — ORAL CARE MOUTH RINSE: 15.0000 mL | OROMUCOSAL | Status: DC | PRN

## 2023-02-10 MED ORDER — SPIRONOLACTONE 25 MG PO TABS
25.0000 mg | ORAL_TABLET | Freq: Every day | ORAL | Status: DC
  Administered 2023-02-10 – 2023-02-11 (×2): 25 mg via ORAL
  Filled 2023-02-10 (×2): qty 1

## 2023-02-10 SURGICAL SUPPLY — 15 items

## 2023-02-10 NOTE — Progress Notes (Signed)
-   Message from hospitalist.  Patient with  elevated D-dimer.  They are ordering CT chest to rule out PE.    He also has right lower extremity swelling which is more than the left lower extremity swelling.  He may benefit from ultrasound lower extremity Doppler to rule out DVT.  -Will cancel EGD for now.   Kathi Der MD, FACP 02/10/2023, 12:16 PM  Contact #  [phone]

## 2023-02-10 NOTE — TOC Initial Note (Signed)
.  Transition of Care [facility]) - Initial/Assessment Note    Patient Details  Name: Alfred Hicks MRN: [id number] Date of Birth: [birth year]  [facility]) CM/SW Contact:    Durenda Guthrie, RN Phone Number: 02/10/2023, 10:36 AM  Clinical Narrative:                  Transition of Care [facility]) Department has reviewed patient and no TOC needs have been identified at this time. We will continue to monitor patient advancement through Interdisciplinary progressions and if new patient needs arise, please place a consult.       Patient Goals and CMS Choice            Expected Discharge Plan and Services                                              Prior Living Arrangements/Services                       Activities of Daily Living Home Assistive Devices/Equipment: None ADL Screening (condition at time of admission) Patient's cognitive ability adequate to safely complete daily activities?: Yes Is the patient deaf or have difficulty hearing?: No Does the patient have difficulty seeing, even when wearing glasses/contacts?: No Does the patient have difficulty concentrating, remembering, or making decisions?: No Patient able to express need for assistance with ADLs?: No Does the patient have difficulty dressing or bathing?: Yes Independently performs ADLs?: Yes (appropriate for developmental age) Does the patient have difficulty walking or climbing stairs?: Yes Weakness of Legs: Both Weakness of Arms/Hands: None  Permission Sought/Granted                  Emotional Assessment              Admission diagnosis:  Abdominal distention [R14.0] Alcohol abuse [F10.10] Transaminitis [R74.01] Abdominal pain [R10.9] Patient Active Problem List   Diagnosis Date Noted   Abdominal pain 02/09/2023   Psychophysiological insomnia 01/02/2020   Generalized anxiety disorder with panic attacks 07/11/2018   Major depressive disorder, single  episode, severe, specified as with psychotic behavior 08/30/2012   PCP:  Raynelle Bring [facility] Pharmacy:   [facility] - [place], [place] - [address] [place] [address] Phone: [phone] Fax: [phone]     Social Determinants of Health (SDOH) Social History: SDOH Screenings   Food Insecurity: No Food Insecurity (02/10/2023)  Housing: Low Risk  (02/10/2023)  Transportation Needs: No Transportation Needs (02/10/2023)  Utilities: Not At Risk (02/10/2023)  Tobacco Use: High Risk (02/10/2023)   SDOH Interventions:     Readmission Risk Interventions     No data to display

## 2023-02-10 NOTE — Progress Notes (Signed)
Consent not obtained due to patient having questions regarding the procedure.

## 2023-02-10 NOTE — Progress Notes (Signed)
PROGRESS NOTE  Alfred Hicks  ZOX:[id number] DOB: 11-Feb-1981 DOA: 02/09/2023 PCP: Raynelle Bring [facility]   Brief Narrative:  Patient is a 42 year old male with history of alcohol abuse, alcoholic hepatic steatosis, major depressive disorder, generalized anxiety disorder who presented with complaint of abdominal distention, discomfort, bilateral lower extremity edema.  Symptoms have been ongoing for last 2 to 3 weeks.  Last alcoholic drink on the day of presentation to the ED.  On presentation, he was found to be volume overloaded.  Hypertensive, tachycardic.  CT abdomen/pelvis showed small right pleural effusion, mild ascites, hepatic steatosis.  GI also consulted.  Plan for paracentesis.  GI planning for EGD today to rule out esophageal varices.   Assessment & Plan:  Principal Problem:   Abdominal pain  Abdominal distention: This is most likely from ascites.  History of alcoholic hepatic steatosis.  Paracentesis ordered.  Will follow ascitic fluid analysis. Given Lasix Iv once  Alcoholic hepatic steatosis with ascites: GI already following.  Plan for EGD to rule out varices.  Elevated liver enzymes.  Avoid hepatotoxic agents.  Liver enzymes improving.  Alcohol abuse: Concern for withdrawal.  Alcohol level was 114 on presentation.  Initiated CIWA protocol.  Continue thiamine and folic acid.  Hypoalbuminemia, lower extremity edema: Given Lasix.  Also given albumin.  Elevated D-dimer: Unclear etiology.  Patient not hypoxic.  We will check CT angiogram chest/abdomen/pelvis to rule out PE, portal/splenic vein thrombosis.  Will check LE venous ultrasound for DVT.  Hyponatremia: This is mostly secondary to volume overload.  Given Lasix once.  Anxiety/depression: Continue home regimen          DVT prophylaxis:enoxaparin (LOVENOX) injection 40 mg Start: 02/09/23 2200     Code Status: Full Code  Family Communication: None at bedside  Patient status:Obs  Patient is  from :Home  Anticipated discharge WJ:XBJY  Estimated DC date:1-2 days   Consultants: GI  Procedures:None  Antimicrobials:  Anti-infectives (From admission, onward)    None       Subjective: Patient seen and examined at bedside today on room air.  Hemodynamically stable.  Lying in bed.  Lower extremity edema has improved.  Complains of some abdominal discomfort though the ascites has improved since yesterday  Objective: Vitals:   02/09/23 2024 02/10/23 0013 02/10/23 0500 02/10/23 0754  BP: (!) 138/95 127/81  121/84  Pulse: 94 (!) 101  89  Resp: 17 19    Temp: 98.6 F (37 C) 98.7 F (37.1 C)  99 F (37.2 C)  TempSrc: Oral Oral    SpO2: 97% 92%  95%  Weight:   88.6 kg    No intake or output data in the 24 hours ending 02/10/23 1106 Filed Weights   02/10/23 0500  Weight: 88.6 kg    Examination:  General exam: Overall comfortable, not in distress HEENT: PERRL Respiratory system:  no wheezes or crackles  Cardiovascular system: S1 & S2 heard, RRR.  Gastrointestinal system: Abdomen is distended, soft and mild generalized tenderness Central nervous system: Alert and oriented Extremities: Trace bilateral lower extremity edema, no clubbing ,no cyanosis Skin: No rashes, no ulcers,no icterus     Data Reviewed: I have personally reviewed following labs and imaging studies  CBC: Recent Labs  Lab 02/09/23 1401 02/10/23 0532  WBC 11.6* 6.9  NEUTROABS 9.2*  --   HGB 14.5 12.7*  HCT 44.3 38.4*  MCV 102.8* 103.5*  PLT 165 137*   Basic Metabolic Panel: Recent Labs  Lab 02/09/23 1401 02/10/23 0532  NA 132* 135  K 3.7 3.6  CL 99 95*  CO2 25 30  GLUCOSE 106* 102*  BUN 6 9  CREATININE 0.61 0.64  CALCIUM 8.2* 8.6*  MG  --  2.4  PHOS  --  4.3     No results found for this or any previous visit (from the past 240 hour(s)).   Radiology Studies: CT ABDOMEN PELVIS W CONTRAST  Result Date: 02/09/2023 CLINICAL DATA:  Generalized abdominal pain. EXAM: CT  ABDOMEN AND PELVIS WITH CONTRAST TECHNIQUE: Multidetector CT imaging of the abdomen and pelvis was performed using the standard protocol following bolus administration of intravenous contrast. RADIATION DOSE REDUCTION: This exam was performed according to the departmental dose-optimization program which includes automated exposure control, adjustment of the mA and/or kV according to patient size and/or use of iterative reconstruction technique. CONTRAST:  OMNIPAQUE IOHEXOL 300 MG/ML  SOLN COMPARISON:  January 20, 2023. FINDINGS: Lower chest: Small right pleural effusion is noted with minimal adjacent subsegmental atelectasis. Hepatobiliary: No cholelithiasis or biliary dilatation is noted. Hepatic steatosis is noted. Pancreas: Unremarkable. No pancreatic ductal dilatation or surrounding inflammatory changes. Spleen: Normal in size without focal abnormality. Adrenals/Urinary Tract: Adrenal glands are unremarkable. Kidneys are normal, without renal calculi, focal lesion, or hydronephrosis. Bladder is unremarkable. Stomach/Bowel: Stomach is within normal limits. Appendix appears normal. No evidence of bowel wall thickening, distention, or inflammatory changes. Vascular/Lymphatic: Aortic atherosclerosis. Mildly enlarged periaortic and portacaval lymph nodes are noted which most likely are inflammatory or reactive in etiology. Reproductive: Prostate is unremarkable. Other: Mild ascites is noted in the pelvis and around the liver and spleen. Musculoskeletal: No acute or significant osseous findings. IMPRESSION: Small right pleural effusion with minimal adjacent subsegmental atelectasis. Hepatic steatosis. Mild ascites. Mildly enlarged periaortic and portacaval lymph nodes are noted which most likely are inflammatory or reactive in etiology. Aortic Atherosclerosis (ICD10-I70.0). Electronically Signed   By: Lupita Raider M.D.   On: 02/09/2023 16:06   DG Chest Portable 1 View  Result Date: 02/09/2023 CLINICAL DATA:   Edema. EXAM: PORTABLE CHEST 1 VIEW COMPARISON:  CT abdomen 01/20/2023 and chest radiograph 01/20/2023 FINDINGS: Single-view of the chest demonstrates slightly decreased lung volumes. Evidence for a small right pleural effusion. No overt pulmonary edema. Heart size is within normal limits and stable. The trachea is midline. Negative for a pneumothorax. No acute bone abnormality. IMPRESSION: 1. Low lung volumes with small right pleural effusion. Findings are similar to previous examination. Electronically Signed   By: Richarda Overlie M.D.   On: 02/09/2023 14:10    Scheduled Meds:  enoxaparin (LOVENOX) injection  40 mg Subcutaneous QHS   folic acid  1 mg Oral Daily   multivitamin with minerals  1 tablet Oral Daily   nicotine  7 mg Transdermal Daily   sertraline  50 mg Oral Daily   thiamine  100 mg Oral Daily   Or   thiamine  100 mg Intravenous Daily   Continuous Infusions:  sodium chloride       LOS: 0 days   Burnadette Pop, MD [facility] P4/17/2024, 11:06 AM

## 2023-02-10 NOTE — Progress Notes (Signed)
Bilateral lower extremity venous study completed.   Preliminary results relayed to RN.  Please see CV Procedures for preliminary results.  Cortlyn Cannell, RVT  1:52 PM 02/10/23

## 2023-02-10 NOTE — Progress Notes (Signed)
Patient was brought to [place] suite for ordered paracentesis. Upon examination with [place], no accessible fluid pocket in the abdomen was identified. Patient reports that his abdominal bloating is markedly improved following treatment with diuretics. No paracentesis was attempted today. Order changed to limited abdominal ultrasound. Please contact IR with any questions or concerns. Kennieth Francois, PA-C 02/10/2023 2:58 PM

## 2023-02-10 NOTE — TOC Initial Note (Signed)
Transition of Care [facility]) - Initial/Assessment Note    Patient Details  Name: Alfred Hicks MRN: [id number] Date of Birth: [birth year]  Transition of Care [facility]) CM/SW Contact:    Otelia Santee, LCSW Phone Number: 02/10/2023, 11:09 AM  Clinical Narrative:                 Met with pt to discuss current alcohol use and recovery efforts. Pt states he has been cutting back on drinking however, did have 2 beers yesterday. He says he does not plan to continue to drink alcohol as this is a life or death matter for him. He does not currently have a counselor but, is interested in telehealth. Pt agreeable to have resources placed on discharge paperwork. SA resources have been added to AVS.   Expected Discharge Plan: Home/Self Care Barriers to Discharge: No Barriers Identified   Patient Goals and CMS Choice Patient states their goals for this hospitalization and ongoing recovery are:: To go home CMS Medicare.gov Compare Post Acute Care list provided to:: Patient Choice offered to / list presented to : Patient [place] ownership interest in [facility].provided to:: Patient    Expected Discharge Plan and Services In-house Referral: Clinical Social Work Discharge Planning Services: NA Post Acute Care Choice: NA Living arrangements for the past 2 months: Single Family Home                 DME Arranged: N/A DME Agency: NA                  Prior Living Arrangements/Services Living arrangements for the past 2 months: Single Family Home Lives with:: Self Patient language and need for interpreter reviewed:: Yes Do you feel safe going back to the place where you live?: Yes      Need for Family Participation in Patient Care: No (Comment) Care giver support system in place?: No (comment)   Criminal Activity/Legal Involvement Pertinent to Current Situation/Hospitalization: No - Comment as needed  Activities of Daily Living Home Assistive Devices/Equipment: None ADL  Screening (condition at time of admission) Patient's cognitive ability adequate to safely complete daily activities?: Yes Is the patient deaf or have difficulty hearing?: No Does the patient have difficulty seeing, even when wearing glasses/contacts?: No Does the patient have difficulty concentrating, remembering, or making decisions?: No Patient able to express need for assistance with ADLs?: No Does the patient have difficulty dressing or bathing?: Yes Independently performs ADLs?: Yes (appropriate for developmental age) Does the patient have difficulty walking or climbing stairs?: Yes Weakness of Legs: Both Weakness of Arms/Hands: None  Permission Sought/Granted   Permission granted to share information with : No              Emotional Assessment Appearance:: Appears stated age Attitude/Demeanor/Rapport: Engaged Affect (typically observed): Accepting Orientation: : Oriented to Self, Oriented to Place, Oriented to  Time, Oriented to Situation Alcohol / Substance Use: Alcohol Use Psych Involvement: No (comment)  Admission diagnosis:  Abdominal distention [R14.0] Alcohol abuse [F10.10] Transaminitis [R74.01] Abdominal pain [R10.9] Patient Active Problem List   Diagnosis Date Noted   Abdominal pain 02/09/2023   Psychophysiological insomnia 01/02/2020   Generalized anxiety disorder with panic attacks 07/11/2018   Major depressive disorder, single episode, severe, specified as with psychotic behavior 08/30/2012   PCP:  Raynelle Bring [facility] Pharmacy:   [facility] - [place], [place] - [address] [place] [address] Phone:  [phone] Fax: [phone]     Social Determinants of Health (SDOH) Social History: SDOH Screenings   Food Insecurity: No Food Insecurity (02/10/2023)  Housing: Low Risk  (02/10/2023)  Transportation Needs: No Transportation Needs (02/10/2023)  Utilities: Not At Risk (02/10/2023)  Tobacco Use: High Risk (02/10/2023)    SDOH Interventions:     Readmission Risk Interventions     No data to display

## 2023-02-10 NOTE — Consult Note (Signed)
Referring Provider:  TH Primary Care Physician:  Raynelle Bring [facility] Primary Gastroenterologist: Gentry Fitz  Reason for Consultation: Alcoholic cirrhosis with ascites  HPI: Alfred Hicks is a 42 y.o. male with past medical history of alcohol use presented to the hospital with worsening lower extremity swelling, abdominal distention and abdominal pain.  Patient left AMA on January 20, 2023 after being seen for similar reason in the emergency department.  CT abdomen pelvis with contrast showed hepatic steatosis as well as small volume ascites with small right pleural effusion.  Negative hepatitis panel in March 2024.  Positive blood alcohol level on admission.  Blood work on admission showed mild leukocytosis with white count of 11.6, elevated MCV otherwise normal hemoglobin and platelets counts.  CMP shows AST 148, ALT 57 alk phos 135 and normal T. bili.  INR.  Patient seen and examined at bedside.  He admits intermittent vomiting for many months now.  Also complaining of intermittent epigastric and generalized abdominal pain.  His abdominal distention has improved since admission because of IV diuretics according to patient.  His lower extremity swelling is also improved.  He denies any blood in the stool or black stool.  Admits drinking 6-12 beers daily for many years.  Past Medical History:  Diagnosis Date   Anxiety    Depression    Mental disorder     Past Surgical History:  Procedure Laterality Date   TOOTH EXTRACTION  2000    Prior to Admission medications   Medication Sig Start Date End Date Taking? Authorizing Provider  buPROPion (WELLBUTRIN XL) 150 MG 24 hr tablet TAKE 3 TABLETS BY MOUTH EVERY MORNING 09/04/22  Yes Mozingo, Thereasa Solo, NP  clonazePAM (KLONOPIN) 1 MG tablet Take 1 tablet (1 mg total) by mouth 3 (three) times daily as needed for anxiety. 12/10/22  Yes Mozingo, Thereasa Solo, NP  furosemide (LASIX) 20 MG tablet Take 1 tablet (20 mg total) by  mouth daily. 01/20/23  Yes Terrilee Files, MD  potassium chloride (KLOR-CON) 10 MEQ tablet Take 1 tablet (10 mEq total) by mouth daily. 01/20/23  Yes Terrilee Files, MD  traZODone (DESYREL) 100 MG tablet Take 1 tablet (100 mg total) by mouth at bedtime. Patient taking differently: Take 100 mg by mouth at bedtime as needed for sleep. 09/04/22  Yes Mozingo, Thereasa Solo, NP  metoCLOPramide (REGLAN) 10 MG tablet Take 1 tablet (10 mg total) by mouth every 6 (six) hours as needed for nausea. Patient not taking: Reported on 02/09/2023 12/28/13   Elpidio Anis, PA-C  sertraline (ZOLOFT) 50 MG tablet Take 1 tablet (50 mg total) by mouth daily. Patient not taking: Reported on 02/09/2023 09/04/22   Mozingo, Thereasa Solo, NP    Scheduled Meds:  enoxaparin (LOVENOX) injection  40 mg Subcutaneous QHS   folic acid  1 mg Oral Daily   multivitamin with minerals  1 tablet Oral Daily   nicotine  7 mg Transdermal Daily   sertraline  50 mg Oral Daily   thiamine  100 mg Oral Daily   Or   thiamine  100 mg Intravenous Daily   Continuous Infusions:  albumin human 25 g (02/10/23 0419)   PRN Meds:.LORazepam **OR** LORazepam, melatonin, polyethylene glycol, prochlorperazine  Allergies as of 02/09/2023   (No Known Allergies)    History reviewed. No pertinent family history.  Social History   Socioeconomic History   Marital status: Single    Spouse name: Not on file   Number of children: Not on file  Years of education: Not on file   Highest education level: Not on file  Occupational History   Not on file  Tobacco Use   Smoking status: Every Day    Packs/day: 1.50    Years: 15.00    Additional pack years: 0.00    Total pack years: 22.50    Types: Cigarettes   Smokeless tobacco: Never  Substance and Sexual Activity   Alcohol use: Yes    Alcohol/week: 0.0 standard drinks of alcohol    Comment: none since discharge from [facility] 08/[facility]   Drug use: No   Sexual activity: Yes    Birth  control/protection: Condom    Comment: GF on birth control  Other Topics Concern   Not on file  Social History Narrative   Not on file   Social Determinants of Health   Financial Resource Strain: Not on file  Food Insecurity: No Food Insecurity (02/10/2023)   Hunger Vital Sign    Worried About Running Out of Food in the Last Year: Never true    Ran Out of Food in the Last Year: Never true  Transportation Needs: No Transportation Needs (02/10/2023)   PRAPARE - Administrator, Civil Service (Medical): No    Lack of Transportation (Non-Medical): No  Physical Activity: Not on file  Stress: Not on file  Social Connections: Not on file  Intimate Partner Violence: Not At Risk (02/10/2023)   Humiliation, Afraid, Rape, and Kick questionnaire    Fear of Current or Ex-Partner: No    Emotionally Abused: No    Physically Abused: No    Sexually Abused: No    Review of Systems: All negative except as stated above in HPI.  Physical Exam: Vital signs: Vitals:   02/10/23 0013 02/10/23 0754  BP: 127/81 121/84  Pulse: (!) 101 89  Resp: 19   Temp: 98.7 F (37.1 C) 99 F (37.2 C)  SpO2: 92% 95%   Last BM Date : 02/08/23 General:   Alert,  Well-developed, well-nourished, pleasant and cooperative in NAD Normocephalic, atraumatic, extraocular movement intact Lungs:  Clear throughout to auscultation.   No wheezes, crackles, or rhonchi. No acute distress. Heart:  Regular rate and rhythm; no murmurs, clicks, rubs,  or gallops. Abdomen: Abdomen is minimally distended, abdomen is soft, bowel sounds present, no peritoneal signs Alert and oriented x 3 Mood and affect normal 1+ lower extremity edema Rectal:  Deferred  GI:  Lab Results: Recent Labs    02/09/23 1401 02/10/23 0532  WBC 11.6* 6.9  HGB 14.5 12.7*  HCT 44.3 38.4*  PLT 165 137*   BMET Recent Labs    02/09/23 1401 02/10/23 0532  NA 132* 135  K 3.7 3.6  CL 99 95*  CO2 25 30  GLUCOSE 106* 102*  BUN 6 9   CREATININE 0.61 0.64  CALCIUM 8.2* 8.6*   LFT Recent Labs    02/10/23 0532  PROT 6.9  ALBUMIN 3.4*  AST 94*  ALT 41  ALKPHOS 98  BILITOT 1.6*   PT/INR Recent Labs    02/09/23 2114  LABPROT 14.1  INR 1.1     Studies/Results: CT ABDOMEN PELVIS W CONTRAST  Result Date: 02/09/2023 CLINICAL DATA:  Generalized abdominal pain. EXAM: CT ABDOMEN AND PELVIS WITH CONTRAST TECHNIQUE: Multidetector CT imaging of the abdomen and pelvis was performed using the standard protocol following bolus administration of intravenous contrast. RADIATION DOSE REDUCTION: This exam was performed according to the departmental dose-optimization program which includes automated exposure control,  adjustment of the mA and/or kV according to patient size and/or use of iterative reconstruction technique. CONTRAST:  OMNIPAQUE IOHEXOL 300 MG/ML  SOLN COMPARISON:  January 20, 2023. FINDINGS: Lower chest: Small right pleural effusion is noted with minimal adjacent subsegmental atelectasis. Hepatobiliary: No cholelithiasis or biliary dilatation is noted. Hepatic steatosis is noted. Pancreas: Unremarkable. No pancreatic ductal dilatation or surrounding inflammatory changes. Spleen: Normal in size without focal abnormality. Adrenals/Urinary Tract: Adrenal glands are unremarkable. Kidneys are normal, without renal calculi, focal lesion, or hydronephrosis. Bladder is unremarkable. Stomach/Bowel: Stomach is within normal limits. Appendix appears normal. No evidence of bowel wall thickening, distention, or inflammatory changes. Vascular/Lymphatic: Aortic atherosclerosis. Mildly enlarged periaortic and portacaval lymph nodes are noted which most likely are inflammatory or reactive in etiology. Reproductive: Prostate is unremarkable. Other: Mild ascites is noted in the pelvis and around the liver and spleen. Musculoskeletal: No acute or significant osseous findings. IMPRESSION: Small right pleural effusion with minimal adjacent  subsegmental atelectasis. Hepatic steatosis. Mild ascites. Mildly enlarged periaortic and portacaval lymph nodes are noted which most likely are inflammatory or reactive in etiology. Aortic Atherosclerosis (ICD10-I70.0). Electronically Signed   By: Lupita Raider M.D.   On: 02/09/2023 16:06   DG Chest Portable 1 View  Result Date: 02/09/2023 CLINICAL DATA:  Edema. EXAM: PORTABLE CHEST 1 VIEW COMPARISON:  CT abdomen 01/20/2023 and chest radiograph 01/20/2023 FINDINGS: Single-view of the chest demonstrates slightly decreased lung volumes. Evidence for a small right pleural effusion. No overt pulmonary edema. Heart size is within normal limits and stable. The trachea is midline. Negative for a pneumothorax. No acute bone abnormality. IMPRESSION: 1. Low lung volumes with small right pleural effusion. Findings are similar to previous examination. Electronically Signed   By: Richarda Overlie M.D.   On: 02/09/2023 14:10    Impression/Plan: -Abdominal distention and mild lower extremity swelling in a patient with chronic alcohol use.  CT scan did not showed any evidence of cirrhosis.  Patient with minimally elevated AST otherwise normal LFTs.  Normal INR.  Mildly elevated platelet count could be from bone marrow suppression from alcohol use.  His abdominal distention and lower extremity swelling could be from alcohol induced liver disease. -Intermittent nausea and vomiting. ?  Alcohol induced.  Recommendations ---------------------- -Recommend EGD today to rule out varices as well as for evaluation of intermittent nausea and vomiting. -Paracentesis has been ordered but not sure there is enough fluid for paracentesis. -Recommend low-sodium diet. -Absolute alcohol abstinence discussed with the patient. -May need low-dose diuretics such as Lasix 20 mg and spironolactone 25 mg on discharge.  Risks (bleeding, infection, bowel perforation that could require surgery, sedation-related changes in cardiopulmonary  systems), benefits (identification and possible treatment of source of symptoms, exclusion of certain causes of symptoms), and alternatives (watchful waiting, radiographic imaging studies, empiric medical treatment)  were explained to patient/family in detail and patient wishes to proceed.     LOS: 0 days   Kathi Der  MD, FACP 02/10/2023, 9:00 AM  Contact #  [phone]

## 2023-02-10 NOTE — Anesthesia Preprocedure Evaluation (Deleted)
Anesthesia Evaluation    Reviewed: Allergy & Precautions, Patient's Chart, lab work & pertinent test results  History of Anesthesia Complications Negative for: history of anesthetic complications  Airway        Dental   Pulmonary Current Smoker Small right pleural effusion          Cardiovascular negative cardio ROS      Neuro/Psych   Anxiety Depression       GI/Hepatic ,,,(+) Cirrhosis   ascites  substance abuse (6-12 drinks/day)  alcohol useAST 94, ALT 41 N/V   Endo/Other  negative endocrine ROS    Renal/GU negative Renal ROS     Musculoskeletal   Abdominal   Peds  Hematology Hgb 12.7, Plt 137k   Anesthesia Other Findings Day of surgery medications reviewed with patient.  Reproductive/Obstetrics                             Anesthesia Physical Anesthesia Plan  ASA: 4  Anesthesia Plan: MAC   Post-op Pain Management: Minimal or no pain anticipated   Induction:   PONV Risk Score and Plan: 0 and Treatment may vary due to age or medical condition and Propofol infusion  Airway Management Planned: Natural Airway and Nasal Cannula  Additional Equipment: None  Intra-op Plan:   Post-operative Plan:   Informed Consent:   Plan Discussed with:   Anesthesia Plan Comments:        Anesthesia Quick Evaluation

## 2023-02-11 DIAGNOSIS — R109 Unspecified abdominal pain: Secondary | ICD-10-CM | POA: Diagnosis not present

## 2023-02-11 MED ORDER — FOLIC ACID 1 MG PO TABS: 1.0000 mg | ORAL_TABLET | Freq: Every day | ORAL | 1 refills | Status: AC

## 2023-02-11 MED ORDER — SPIRONOLACTONE 25 MG PO TABS: 25.0000 mg | ORAL_TABLET | Freq: Every day | ORAL | 1 refills | Status: AC

## 2023-02-11 MED ORDER — PANTOPRAZOLE SODIUM 40 MG PO TBEC: 40.0000 mg | DELAYED_RELEASE_TABLET | Freq: Every day | ORAL | 0 refills | Status: AC

## 2023-02-11 MED ORDER — FUROSEMIDE 20 MG PO TABS: 20.0000 mg | ORAL_TABLET | Freq: Every day | ORAL | 1 refills | Status: AC

## 2023-02-11 MED ORDER — VITAMIN B-1 100 MG PO TABS: 100.0000 mg | ORAL_TABLET | Freq: Every day | ORAL | 1 refills | Status: AC

## 2023-02-11 NOTE — Progress Notes (Signed)
[facility] Gastroenterology Progress Note  Alfred Hicks 42 y.o. 17-May-1981  CC: Possible cirrhosis, alcohol induced liver disease   Subjective: Patient seen and examined at bedside.  Denies any acute GI issues.  Abdominal swelling resolved.  Denies any blood in the stool or black stool.  Wants to go home.  ROS : Afebrile, negative for chest pain   Objective: Vital signs in last 24 hours: Vitals:   02/10/23 1937 02/11/23 0441  BP: 114/83 123/84  Pulse: 87 85  Resp: 18 19  Temp: 98 F (36.7 C) 97.9 F (36.6 C)  SpO2: 96% 94%    Physical Exam:  General:  Alert, cooperative, no distress, appears stated age  Head:  Normocephalic, without obvious abnormality, atraumatic  Eyes:  , EOM's intact,   Lungs:   Clear to auscultation bilaterally, respirations unlabored  Heart:  Regular rate and rhythm, S1, S2 normal  Abdomen:   Soft, non-tender, bowel sounds active all four quadrants,  no masses,   LE  Trace edema bilaterally, no significant swelling today       Lab Results: Recent Labs    02/09/23 1401 02/10/23 0532  NA 132* 135  K 3.7 3.6  CL 99 95*  CO2 25 30  GLUCOSE 106* 102*  BUN 6 9  CREATININE 0.61 0.64  CALCIUM 8.2* 8.6*  MG  --  2.4  PHOS  --  4.3   Recent Labs    02/09/23 1401 02/10/23 0532  AST 148* 94*  ALT 57* 41  ALKPHOS 135* 98  BILITOT 0.8 1.6*  PROT 7.5 6.9  ALBUMIN 3.4* 3.4*   Recent Labs    02/09/23 1401 02/10/23 0532  WBC 11.6* 6.9  NEUTROABS 9.2*  --   HGB 14.5 12.7*  HCT 44.3 38.4*  MCV 102.8* 103.5*  PLT 165 137*   Recent Labs    02/09/23 2114  LABPROT 14.1  INR 1.1      Assessment/Plan: -Abdominal distention and mild lower extremity swelling in a patient with chronic alcohol use.  CT scan did not showed any evidence of cirrhosis.  Patient with minimally elevated AST otherwise normal LFTs.  Normal INR.  Mildly elevated platelet count could be from bone marrow suppression from alcohol use.  His abdominal distention and  lower extremity swelling could be from alcohol induced liver disease.  CT chest PE protocol showed finding concerning for early cirrhosis. -Intermittent nausea and vomiting. ?  Alcohol induced.  Resolved.   Recommendations ---------------------- -Not enough ascites fluid for paracentesis.  Lower extremity edema also resolved. -EGD was canceled yesterday because of concern for PE or DVT needing further investigation.  CT chest PE protocol done lower extremity Dopplers negative. -Unfortunately, there are no slots for EGD today and patient does not want to wait till tomorrow to get EGD done because he does not have any evidence of bleeding and his abdominal pain has resolved.  -Absolute alcohol abstinence discussed with the patient.  -Recommend 2 g sodium diet -Recommend Lasix 20 mg and spironolactone 25 mg once a day on discharge.  He will need repeat labs in 2 to 4 weeks which can be done with the PCP. -Recommend Protonix 40 mg daily for 8 weeks for possible alcohol induced gastritis -Avoid NSAIDs -Follow-up in GI clinic in 2 months for further workup and to schedule outpatient EGD to rule out varices. -GI will sign off.  Call us back if needed.  Kathi Der MD, FACP 02/11/2023, 11:39 AM  Contact #  [phone]

## 2023-02-11 NOTE — Discharge Summary (Signed)
Physician Discharge Summary  Alfred Hicks ZOX:[id number] DOB: 1980/12/03 DOA: 02/09/2023  PCP: Raynelle Bring [facility]  Admit date: 02/09/2023 Discharge date: 02/11/2023  Admitted From: Home Disposition:  Home  Discharge Condition:Stable CODE STATUS:FULL Diet recommendation: Low sodium diet  Brief/Interim Summary: Patient is a 42 year old male with history of alcohol abuse, alcoholic hepatic steatosis, major depressive disorder, generalized anxiety disorder who presented with complaint of abdominal distention, discomfort, bilateral lower extremity edema. Symptoms had been ongoing for last 2 to 3 weeks. Last alcoholic drink on the day of presentation to the ED. On presentation, he was found to be volume overloaded. Hypertensive, tachycardic. CT abdomen/pelvis showed small right pleural effusion, mild ascites, hepatic steatosis. GI also consulted.  Patient was given a dose of IV Lasix during this hospitalization and started on oral Lasix and spironolactone.  Currently looks almost euvolemic.  Attempted paracentesis but there was not enough fluid to be taken out. GI was planning for EGD today to rule out esophageal varices but slot was not available so recommended to do as an outpatient.  Medically stable for discharge today.  Following problems were addressed during the hospitalization:  Abdominal distention: This was most likely from ascites.  History of alcoholic hepatic steatosis.  Paracentesis ordered, attempted but aborted because not enough fluid was available. Given Lasix Iv once.  Continue Lasix, spironolactone at home   Alcoholic hepatic steatosis with ascites: GI was following.  Plan for EGD to rule out varices.  Elevated liver enzymes,now almost normal.    Alcohol abuse:   Alcohol level was 114 on presentation.  Initiated CIWA protocol.  Continue thiamine and folic acid.  Multiple counseling done to quit alcohol   Hypoalbuminemia, lower extremity edema: Given Lasix.   Also given albumin.  Continue Lasix and spironolactone at home   Elevated D-dimer: Unclear etiology.  Patient not hypoxic.  CT angiogram chest/abdomen/pelvis to rule out PE, portal/splenic vein thrombosis found to be negative.  LE venous ultrasound for DVT came out to be negative.   Hyponatremia: This is mostly secondary to alcoholic liver disease, given iv Lasix once.  Currently stable   Anxiety/depression: Continue home regimen   Discharge Diagnoses:  Principal Problem:   Abdominal pain Active Problems:   Ascites    Discharge Instructions  Discharge Instructions     Diet - low sodium heart healthy   Complete by: As directed    2 gm sodium   Discharge instructions   Complete by: As directed    1)Please take prescribed medications as instructed 2)Follow up with your PCP in a week.  Do a BMP test to check your kidney function during the follow-up 3) follow-up with gastroenterology as an outpatient.  Name and number of  the provider group has been attached 4)Quit alcohol   Increase activity slowly   Complete by: As directed       Allergies as of 02/11/2023   No Known Allergies      Medication List     TAKE these medications    buPROPion 150 MG 24 hr tablet Commonly known as: WELLBUTRIN XL TAKE 3 TABLETS BY MOUTH EVERY MORNING   clonazePAM 1 MG tablet Commonly known as: KLONOPIN Take 1 tablet (1 mg total) by mouth 3 (three) times daily as needed for anxiety.   folic acid 1 MG tablet Commonly known as: FOLVITE Take 1 tablet (1 mg total) by mouth daily. Start taking on: February 12, 2023   furosemide 20 MG tablet Commonly known as: LASIX Take 1  tablet (20 mg total) by mouth daily.   metoCLOPramide 10 MG tablet Commonly known as: REGLAN Take 1 tablet (10 mg total) by mouth every 6 (six) hours as needed for nausea.   pantoprazole 40 MG tablet Commonly known as: Protonix Take 1 tablet (40 mg total) by mouth daily.   potassium chloride 10 MEQ tablet Commonly  known as: KLOR-CON Take 1 tablet (10 mEq total) by mouth daily.   sertraline 50 MG tablet Commonly known as: Zoloft Take 1 tablet (50 mg total) by mouth daily.   spironolactone 25 MG tablet Commonly known as: ALDACTONE Take 1 tablet (25 mg total) by mouth daily. Start taking on: February 12, 2023   thiamine 100 MG tablet Commonly known as: Vitamin B-1 Take 1 tablet (100 mg total) by mouth daily. Start taking on: February 12, 2023   traZODone 100 MG tablet Commonly known as: DESYREL Take 1 tablet (100 mg total) by mouth at bedtime. What changed:  when to take this reasons to take this        Follow-up Information     Gastroenterology, Deboraha Sprang. Schedule an appointment as soon as possible for a visit in 2 month(s).   Why: Alcohol induced liver disease, early cirrhosis, may need out patient EGD Contact information: [address] [place] [address] [phone]         Arsenio Loader, [facility]. Schedule an appointment as soon as possible for a visit in 1 week(s).   Contact information: [address] [place] [address] [phone]                No Known Allergies  Consultations: GI   Procedures/Studies: VAS [place] LOWER EXTREMITY VENOUS (DVT)  Result Date: 02/10/2023  Lower Venous DVT Study Patient Name:  Alfred Hicks  Date of Exam:   02/10/2023 Medical Rec #: [id number]             Accession #:    [id number] Date of Birth: [birth year]            Patient Gender: M Patient Age:   6 years Exam Location:  [facility] Procedure:      VAS [place] LOWER EXTREMITY VENOUS (DVT) Referring Phys: Taylan Mayhan --------------------------------------------------------------------------------  Indications: Edema.  Comparison Study: No previous study. Performing Technologist: McKayla Maag RVT, [place]  Examination Guidelines: A complete evaluation includes B-mode imaging, spectral Doppler, color Doppler, and power Doppler as needed of all accessible  portions of each vessel. Bilateral testing is considered an integral part of a complete examination. Limited examinations for reoccurring indications may be performed as noted. The reflux portion of the exam is performed with the patient in reverse Trendelenburg.  +---------+---------------+---------+-----------+----------+--------------+ RIGHT    CompressibilityPhasicitySpontaneityPropertiesThrombus Aging +---------+---------------+---------+-----------+----------+--------------+ CFV      Full           Yes      Yes                                 +---------+---------------+---------+-----------+----------+--------------+ SFJ      Full                                                        +---------+---------------+---------+-----------+----------+--------------+ FV Prox  Full                                                        +---------+---------------+---------+-----------+----------+--------------+  FV Mid   Full                                                        +---------+---------------+---------+-----------+----------+--------------+ FV DistalFull                                                        +---------+---------------+---------+-----------+----------+--------------+ PFV      Full                                                        +---------+---------------+---------+-----------+----------+--------------+ POP      Full           Yes      Yes                                 +---------+---------------+---------+-----------+----------+--------------+ PTV      Full                                                        +---------+---------------+---------+-----------+----------+--------------+ PERO     Full                                                        +---------+---------------+---------+-----------+----------+--------------+   +---------+---------------+---------+-----------+----------+--------------+ LEFT      CompressibilityPhasicitySpontaneityPropertiesThrombus Aging +---------+---------------+---------+-----------+----------+--------------+ CFV      Full           Yes      Yes                                 +---------+---------------+---------+-----------+----------+--------------+ SFJ      Full                                                        +---------+---------------+---------+-----------+----------+--------------+ FV Prox  Full                                                        +---------+---------------+---------+-----------+----------+--------------+ FV Mid   Full                                                        +---------+---------------+---------+-----------+----------+--------------+  FV DistalFull                                                        +---------+---------------+---------+-----------+----------+--------------+ PFV      Full                                                        +---------+---------------+---------+-----------+----------+--------------+ POP      Full           Yes      Yes                                 +---------+---------------+---------+-----------+----------+--------------+ PTV      Full                                                        +---------+---------------+---------+-----------+----------+--------------+ PERO     Full                                                        +---------+---------------+---------+-----------+----------+--------------+     Summary: BILATERAL: - No evidence of deep vein thrombosis seen in the lower extremities, bilaterally. - No evidence of superficial venous thrombosis in the lower extremities, bilaterally. -No evidence of popliteal cyst, bilaterally.   *See table(s) above for measurements and observations. Electronically signed by Gerarda Fraction on 02/10/2023 at 10:28:19 PM.    Final    CT Angio Abd/Pel w/ and/or w/o  Result Date:  02/10/2023 CLINICAL DATA:  Portal vein thrombosis. Abdominal swelling, chest pain. Elevated D-dimer. EXAM: CT ANGIOGRAPHY CHEST, ABDOMEN AND PELVIS TECHNIQUE: Non-contrast CT of the chest was initially obtained. Multidetector CT imaging through the chest, abdomen and pelvis was performed using the standard protocol during bolus administration of intravenous contrast. Multiplanar reconstructed images and MIPs were obtained and reviewed to evaluate the vascular anatomy. RADIATION DOSE REDUCTION: This exam was performed according to the departmental dose-optimization program which includes automated exposure control, adjustment of the mA and/or kV according to patient size and/or use of iterative reconstruction technique. CONTRAST:  OMNIPAQUE IOHEXOL 350 MG/ML SOLN COMPARISON:  02/09/2023 FINDINGS: CTA CHEST FINDINGS Cardiovascular: Heart size is normal. No pericardial effusion. Normal appearance of the thoracic aorta. The pulmonary arteries are well opacified and there is no acute pulmonary embolus. Mediastinum/Nodes: The visualized portion of the thyroid gland has a normal appearance. No significant mediastinal, hilar, or axillary adenopathy. Esophagus is unremarkable. Lungs/Pleura: There are bilateral pleural effusions, RIGHT greater than LEFT. Minimal bibasilar atelectasis. No suspicious pulmonary nodules. No consolidations or evidence for pulmonary edema. The airways are patent. Musculoskeletal: No chest wall abnormality. No acute or significant osseous findings. Review of the MIP images confirms the above findings. CTA ABDOMEN AND PELVIS FINDINGS VASCULAR Aorta: There is atherosclerotic calcification of the abdominal  aorta, not associated with aneurysm. Celiac: Patent without evidence of aneurysm, dissection, vasculitis or significant stenosis. SMA: Patent without evidence of aneurysm, dissection, vasculitis or significant stenosis. Renals: Both renal arteries are patent without evidence of aneurysm,  dissection, vasculitis, fibromuscular dysplasia or significant stenosis. IMA: Patent without evidence of aneurysm, dissection, vasculitis or significant stenosis. Inflow: Patent without evidence of aneurysm, dissection, vasculitis or significant stenosis. Veins: No obvious venous abnormality within the limitations of this arterial phase study. The portal vein is patent. Review of the MIP images confirms the above findings. NON-VASCULAR Hepatobiliary: Liver is diffusely enlarged and low-attenuation consistent with hepatic steatosis. Marked enlargement of the caudate lobe raises question of cirrhosis. Gallbladder is present. There are no radiopaque calculi. Pancreas: Unremarkable. No pancreatic ductal dilatation or surrounding inflammatory changes. Spleen: Spleen is enlarged. No discrete lesion. Adrenals/Urinary Tract: Adrenal glands are normal in appearance. The RIGHT kidney and ureter are normal in appearance. The LEFT kidney and ureter are normal. The bladder and visualized portion of the urethra are normal. Stomach/Bowel: The stomach is normal in appearance. There is small amount of fluid at the root of the mesentery, similar to prior study. The small bowel loops are otherwise normal in appearance. The appendix is not seen. Colon is unremarkable. Vascular/Lymphatic: There are enlarged nodes in the portacaval and porta hepatis regions, similar to prior and likely reactive in the setting of inflammation. There is minimal atherosclerotic calcification of the abdominal aorta not associated with aneurysm. Reproductive: Prostate is unremarkable. Other: Perihepatic and mesenteric ascites. There is fluid within the root of the mesentery, similar to prior study. Musculoskeletal: Degenerative disc disease at L5-S1. Review of the MIP images confirms the above findings. IMPRESSION: 1. No evidence for acute pulmonary embolus. 2. No evidence for portal vein thrombosis. 3. Bilateral pleural effusions, RIGHT greater than LEFT. 4.  Hepatosplenomegaly and hepatic steatosis. Suspect cirrhosis based on the morphology of the liver. 5. Ascites. 6. Enlarged nodes in the portacaval and porta hepatis regions, likely reactive in the setting of inflammation. 7. Minimal atherosclerotic calcification of the abdominal aorta, not associated with aneurysm. 8. Degenerative disc disease at L5-S1. Electronically Signed   By: Norva Pavlov M.D.   On: 02/10/2023 16:06   CT Angio Chest Pulmonary Embolism (PE) W or WO Contrast  Result Date: 02/10/2023 CLINICAL DATA:  Portal vein thrombosis. Abdominal swelling, chest pain. Elevated D-dimer. EXAM: CT ANGIOGRAPHY CHEST, ABDOMEN AND PELVIS TECHNIQUE: Non-contrast CT of the chest was initially obtained. Multidetector CT imaging through the chest, abdomen and pelvis was performed using the standard protocol during bolus administration of intravenous contrast. Multiplanar reconstructed images and MIPs were obtained and reviewed to evaluate the vascular anatomy. RADIATION DOSE REDUCTION: This exam was performed according to the departmental dose-optimization program which includes automated exposure control, adjustment of the mA and/or kV according to patient size and/or use of iterative reconstruction technique. CONTRAST:  OMNIPAQUE IOHEXOL 350 MG/ML SOLN COMPARISON:  02/09/2023 FINDINGS: CTA CHEST FINDINGS Cardiovascular: Heart size is normal. No pericardial effusion. Normal appearance of the thoracic aorta. The pulmonary arteries are well opacified and there is no acute pulmonary embolus. Mediastinum/Nodes: The visualized portion of the thyroid gland has a normal appearance. No significant mediastinal, hilar, or axillary adenopathy. Esophagus is unremarkable. Lungs/Pleura: There are bilateral pleural effusions, RIGHT greater than LEFT. Minimal bibasilar atelectasis. No suspicious pulmonary nodules. No consolidations or evidence for pulmonary edema. The airways are patent. Musculoskeletal: No chest wall  abnormality. No acute or significant osseous findings. Review of the MIP images confirms  the above findings. CTA ABDOMEN AND PELVIS FINDINGS VASCULAR Aorta: There is atherosclerotic calcification of the abdominal aorta, not associated with aneurysm. Celiac: Patent without evidence of aneurysm, dissection, vasculitis or significant stenosis. SMA: Patent without evidence of aneurysm, dissection, vasculitis or significant stenosis. Renals: Both renal arteries are patent without evidence of aneurysm, dissection, vasculitis, fibromuscular dysplasia or significant stenosis. IMA: Patent without evidence of aneurysm, dissection, vasculitis or significant stenosis. Inflow: Patent without evidence of aneurysm, dissection, vasculitis or significant stenosis. Veins: No obvious venous abnormality within the limitations of this arterial phase study. The portal vein is patent. Review of the MIP images confirms the above findings. NON-VASCULAR Hepatobiliary: Liver is diffusely enlarged and low-attenuation consistent with hepatic steatosis. Marked enlargement of the caudate lobe raises question of cirrhosis. Gallbladder is present. There are no radiopaque calculi. Pancreas: Unremarkable. No pancreatic ductal dilatation or surrounding inflammatory changes. Spleen: Spleen is enlarged. No discrete lesion. Adrenals/Urinary Tract: Adrenal glands are normal in appearance. The RIGHT kidney and ureter are normal in appearance. The LEFT kidney and ureter are normal. The bladder and visualized portion of the urethra are normal. Stomach/Bowel: The stomach is normal in appearance. There is small amount of fluid at the root of the mesentery, similar to prior study. The small bowel loops are otherwise normal in appearance. The appendix is not seen. Colon is unremarkable. Vascular/Lymphatic: There are enlarged nodes in the portacaval and porta hepatis regions, similar to prior and likely reactive in the setting of inflammation. There is minimal  atherosclerotic calcification of the abdominal aorta not associated with aneurysm. Reproductive: Prostate is unremarkable. Other: Perihepatic and mesenteric ascites. There is fluid within the root of the mesentery, similar to prior study. Musculoskeletal: Degenerative disc disease at L5-S1. Review of the MIP images confirms the above findings. IMPRESSION: 1. No evidence for acute pulmonary embolus. 2. No evidence for portal vein thrombosis. 3. Bilateral pleural effusions, RIGHT greater than LEFT. 4. Hepatosplenomegaly and hepatic steatosis. Suspect cirrhosis based on the morphology of the liver. 5. Ascites. 6. Enlarged nodes in the portacaval and porta hepatis regions, likely reactive in the setting of inflammation. 7. Minimal atherosclerotic calcification of the abdominal aorta, not associated with aneurysm. 8. Degenerative disc disease at L5-S1. Electronically Signed   By: Norva Pavlov M.D.   On: 02/10/2023 16:06   [place] ASCITES (ABDOMEN LIMITED)  Result Date: 02/10/2023 CLINICAL DATA:  Evaluate for paracentesis. EXAM: LIMITED ABDOMEN ULTRASOUND FOR ASCITES TECHNIQUE: Limited ultrasound survey for ascites was performed in all four abdominal quadrants. COMPARISON:  CT abdomen and pelvis 02/09/2023 FINDINGS: Minimal ascites identified. Largest amount of fluid in the right lower quadrant. IMPRESSION: Small volume of ascites.  Fluid was not accessible for paracentesis. Electronically Signed   By: Richarda Overlie M.D.   On: 02/10/2023 15:53   CT ABDOMEN PELVIS W CONTRAST  Result Date: 02/09/2023 CLINICAL DATA:  Generalized abdominal pain. EXAM: CT ABDOMEN AND PELVIS WITH CONTRAST TECHNIQUE: Multidetector CT imaging of the abdomen and pelvis was performed using the standard protocol following bolus administration of intravenous contrast. RADIATION DOSE REDUCTION: This exam was performed according to the departmental dose-optimization program which includes automated exposure control, adjustment of the mA and/or kV  according to patient size and/or use of iterative reconstruction technique. CONTRAST:  OMNIPAQUE IOHEXOL 300 MG/ML  SOLN COMPARISON:  January 20, 2023. FINDINGS: Lower chest: Small right pleural effusion is noted with minimal adjacent subsegmental atelectasis. Hepatobiliary: No cholelithiasis or biliary dilatation is noted. Hepatic steatosis is noted. Pancreas: Unremarkable. No pancreatic ductal dilatation  or surrounding inflammatory changes. Spleen: Normal in size without focal abnormality. Adrenals/Urinary Tract: Adrenal glands are unremarkable. Kidneys are normal, without renal calculi, focal lesion, or hydronephrosis. Bladder is unremarkable. Stomach/Bowel: Stomach is within normal limits. Appendix appears normal. No evidence of bowel wall thickening, distention, or inflammatory changes. Vascular/Lymphatic: Aortic atherosclerosis. Mildly enlarged periaortic and portacaval lymph nodes are noted which most likely are inflammatory or reactive in etiology. Reproductive: Prostate is unremarkable. Other: Mild ascites is noted in the pelvis and around the liver and spleen. Musculoskeletal: No acute or significant osseous findings. IMPRESSION: Small right pleural effusion with minimal adjacent subsegmental atelectasis. Hepatic steatosis. Mild ascites. Mildly enlarged periaortic and portacaval lymph nodes are noted which most likely are inflammatory or reactive in etiology. Aortic Atherosclerosis (ICD10-I70.0). Electronically Signed   By: Lupita Raider M.D.   On: 02/09/2023 16:06   DG Chest Portable 1 View  Result Date: 02/09/2023 CLINICAL DATA:  Edema. EXAM: PORTABLE CHEST 1 VIEW COMPARISON:  CT abdomen 01/20/2023 and chest radiograph 01/20/2023 FINDINGS: Single-view of the chest demonstrates slightly decreased lung volumes. Evidence for a small right pleural effusion. No overt pulmonary edema. Heart size is within normal limits and stable. The trachea is midline. Negative for a pneumothorax. No acute bone  abnormality. IMPRESSION: 1. Low lung volumes with small right pleural effusion. Findings are similar to previous examination. Electronically Signed   By: Richarda Overlie M.D.   On: 02/09/2023 14:10   CT Abdomen Pelvis W Contrast  Result Date: 01/20/2023 CLINICAL DATA:  Abdominal distension and shortness of breath. EXAM: CT ABDOMEN AND PELVIS WITH CONTRAST TECHNIQUE: Multidetector CT imaging of the abdomen and pelvis was performed using the standard protocol following bolus administration of intravenous contrast. RADIATION DOSE REDUCTION: This exam was performed according to the departmental dose-optimization program which includes automated exposure control, adjustment of the mA and/or kV according to patient size and/or use of iterative reconstruction technique. CONTRAST:  OMNIPAQUE IOHEXOL 300 MG/ML  SOLN COMPARISON:  Abdominal radiograph dated 01/19/2023. FINDINGS: Lower chest: There is a small right pleural effusion with associated atelectasis. Hepatobiliary: No focal liver abnormality is seen. The liver is diffusely hypoattenuating, consistent with hepatic steatosis. No gallstones, gallbladder wall thickening, or biliary dilatation. Pancreas: Unremarkable. No pancreatic ductal dilatation or surrounding inflammatory changes. Spleen: Normal in size without focal abnormality. Adrenals/Urinary Tract: Adrenal glands are unremarkable. Kidneys are normal, without renal calculi, focal lesion, or hydronephrosis. Bladder is unremarkable. Stomach/Bowel: Stomach is within normal limits. Appendix appears normal. No evidence of bowel wall thickening, distention, or inflammatory changes. Vascular/Lymphatic: Aortic atherosclerosis. An enlarged periportal lymph node measures 1.4 cm in short axis (series 2, image 43). No other abnormally enlarged lymph nodes are seen in the abdomen or pelvis. Reproductive: Prostate is unremarkable. Other: There is moderate volume ascites.  No abdominal wall hernia. Musculoskeletal:  Degenerative changes are seen in the spine, most significant at L5-S1. IMPRESSION: 1. Small right pleural effusion with associated atelectasis. 2. Hepatic steatosis and moderate volume ascites. 3. Enlarged periportal lymph node is nonspecific but may be reactive. Aortic Atherosclerosis (ICD10-I70.0). Electronically Signed   By: Romona Curls M.D.   On: 01/20/2023 19:44   DG Chest 2 View  Result Date: 01/20/2023 CLINICAL DATA:  Shortness of breath. EXAM: CHEST - 2 VIEW COMPARISON:  12/13/2012. FINDINGS: Low lung volumes accentuate the pulmonary vasculature and cardiomediastinal silhouette. No focal airspace opacity. Trace right pleural effusion. No pneumothorax. IMPRESSION: Trace right pleural effusion. Electronically Signed   By: Orvan Falconer M.D.   On:  01/20/2023 17:01      Subjective: Patient seen and examined at bedside today.  Hemodynamically stable for discharge.  Discharge Exam: Vitals:   02/10/23 1937 02/11/23 0441  BP: 114/83 123/84  Pulse: 87 85  Resp: 18 19  Temp: 98 F (36.7 C) 97.9 F (36.6 C)  SpO2: 96% 94%   Vitals:   02/10/23 1549 02/10/23 1937 02/11/23 0441 02/11/23 0710  BP: 120/84 114/83 123/84   Pulse: 76 87 85   Resp: 16 18 19    Temp:  98 F (36.7 C) 97.9 F (36.6 C)   TempSrc:  Oral    SpO2: 96% 96% 94%   Weight:    86.8 kg    General: Pt is alert, awake, not in acute distress Cardiovascular: RRR, S1/S2 +, no rubs, no gallops Respiratory: CTA bilaterally, no wheezing, no rhonchi Abdominal: Soft, NT, ND, bowel sounds + Extremities: no edema, no cyanosis    The results of significant diagnostics from this hospitalization (including imaging, microbiology, ancillary and laboratory) are listed below for reference.     Microbiology: No results found for this or any previous visit (from the past 240 hour(s)).   Labs: BNP (last 3 results) Recent Labs    02/09/23 1401  BNP 50.7   Basic Metabolic Panel: Recent Labs  Lab 02/09/23 1401  02/10/23 0532  NA 132* 135  K 3.7 3.6  CL 99 95*  CO2 25 30  GLUCOSE 106* 102*  BUN 6 9  CREATININE 0.61 0.64  CALCIUM 8.2* 8.6*  MG  --  2.4  PHOS  --  4.3   Liver Function Tests: Recent Labs  Lab 02/09/23 1401 02/10/23 0532  AST 148* 94*  ALT 57* 41  ALKPHOS 135* 98  BILITOT 0.8 1.6*  PROT 7.5 6.9  ALBUMIN 3.4* 3.4*   Recent Labs  Lab 02/09/23 1401  LIPASE 45   Recent Labs  Lab 02/09/23 1401  AMMONIA <10   CBC: Recent Labs  Lab 02/09/23 1401 02/10/23 0532  WBC 11.6* 6.9  NEUTROABS 9.2*  --   HGB 14.5 12.7*  HCT 44.3 38.4*  MCV 102.8* 103.5*  PLT 165 137*   Cardiac Enzymes: No results for input(s): "CKTOTAL", "CKMB", "CKMBINDEX", "TROPONINI" in the last 168 hours. BNP: Invalid input(s): "POCBNP" CBG: No results for input(s): "GLUCAP" in the last 168 hours. D-Dimer Recent Labs    02/09/23 2200  DDIMER 17.16*   Hgb A1c No results for input(s): "HGBA1C" in the last 72 hours. Lipid Profile No results for input(s): "CHOL", "HDL", "LDLCALC", "TRIG", "CHOLHDL", "LDLDIRECT" in the last 72 hours. Thyroid function studies No results for input(s): "TSH", "T4TOTAL", "T3FREE", "THYROIDAB" in the last 72 hours.  Invalid input(s): "FREET3" Anemia work up No results for input(s): "VITAMINB12", "FOLATE", "FERRITIN", "TIBC", "IRON", "RETICCTPCT" in the last 72 hours. Urinalysis No results found for: "COLORURINE", "APPEARANCEUR", "LABSPEC", "PHURINE", "GLUCOSEU", "HGBUR", "BILIRUBINUR", "KETONESUR", "PROTEINUR", "UROBILINOGEN", "NITRITE", "LEUKOCYTESUR" Sepsis Labs Recent Labs  Lab 02/09/23 1401 02/10/23 0532  WBC 11.6* 6.9   Microbiology No results found for this or any previous visit (from the past 240 hour(s)).  Please note: You were cared for by a hospitalist during your hospital stay. Once you are discharged, your primary care physician will handle any further medical issues. Please note that NO REFILLS for any discharge medications will be  authorized once you are discharged, as it is imperative that you return to your primary care physician (or establish a relationship with a primary care physician if you do not have  one) for your post hospital discharge needs so that they can reassess your need for medications and monitor your lab values.    Time coordinating discharge: 40 minutes  SIGNED:   Burnadette Pop, MD  [facility] 02/11/2023, 12:12 PM Pager [phone]  If 7PM-7AM, please contact night-coverage [email or web address] Password [email or web address]

## 2023-03-09 ENCOUNTER — Encounter: Payer: Self-pay | Admitting: Adult Health

## 2023-03-09 ENCOUNTER — Telehealth (INDEPENDENT_AMBULATORY_CARE_PROVIDER_SITE_OTHER): Payer: Commercial Managed Care - PPO | Admitting: Adult Health

## 2023-03-09 DIAGNOSIS — F331 Major depressive disorder, recurrent, moderate: Secondary | ICD-10-CM | POA: Diagnosis not present

## 2023-03-09 DIAGNOSIS — F411 Generalized anxiety disorder: Secondary | ICD-10-CM

## 2023-03-09 DIAGNOSIS — F410 Panic disorder [episodic paroxysmal anxiety] without agoraphobia: Secondary | ICD-10-CM

## 2023-03-09 DIAGNOSIS — F4310 Post-traumatic stress disorder, unspecified: Secondary | ICD-10-CM | POA: Diagnosis not present

## 2023-03-09 DIAGNOSIS — G4700 Insomnia, unspecified: Secondary | ICD-10-CM

## 2023-03-09 MED ORDER — CLONAZEPAM 1 MG PO TABS
1.0000 mg | ORAL_TABLET | Freq: Three times a day (TID) | ORAL | 2 refills | Status: DC | PRN
Start: 2023-03-09 — End: 2023-09-30

## 2023-03-09 MED ORDER — BUPROPION HCL ER (XL) 150 MG PO TB24
ORAL_TABLET | ORAL | 5 refills | Status: DC
Start: 2023-03-09 — End: 2023-09-30

## 2023-03-09 MED ORDER — TRAZODONE HCL 100 MG PO TABS
100.0000 mg | ORAL_TABLET | Freq: Every day | ORAL | 5 refills | Status: DC
Start: 2023-03-09 — End: 2023-09-30

## 2023-03-09 NOTE — Progress Notes (Signed)
Alfred Hicks [id number] 09-09-1981 42 y.o.  Virtual Visit via Video Note  I connected with pt @ on 03/09/23 at  9:00 AM EDT by a video enabled telemedicine application and verified that I am speaking with the correct person using two identifiers.   I discussed the limitations of evaluation and management by telemedicine and the availability of in person appointments. The patient expressed understanding and agreed to proceed.  I discussed the assessment and treatment plan with the patient. The patient was provided an opportunity to ask questions and all were answered. The patient agreed with the plan and demonstrated an understanding of the instructions.   The patient was advised to call back or seek an in-person evaluation if the symptoms worsen or if the condition fails to improve as anticipated.  I provided 25 minutes of non-face-to-face time during this encounter.  The patient was located at home.  The provider was located at [facility].   Dorothyann Gibbs, NP   Subjective:   Patient ID:  Alfred Hicks is a 42 y.o. (DOB October 29, 1980) male.  Chief Complaint: No chief complaint on file.   HPI Alfred Hicks presents for follow-up of  MDD, GAD, PTSD, panic attacks, and insomnia.  Describes mood today as "better". Pleasant. Reports tearfulness. Mood symptoms - reports  depression and anxiety. Reports irritability at times. Reports worry, rumination, and over thinking. Reporting some health concerns - recent hospitalization. Reports financial stressors. Reports panic attacks - "waking up during the night with them". Reports struggling getting to work - "making it when I can". Stating "I feel like my depression is still really bad". Feels like medications are helpful. Varying  interest and motivation. Taking medications as prescribed. Has started seeing a therapist. Energy levels varies. Active, does not have a regular exercise routine.   Enjoys some usual  interests and activities. Single. Lives alone with yorkie (age 20). Has 1 son - teenager. Brother local.  Appetite adequate. Weight gain - "bloated".  Sleeping better some Averages - 6 hours of broken sleep. Focus and concentration stable. Completing tasks. Managing aspects of household. Works full time. Denies SI or HI.  Denies AH or VH. Denies self harm. Denies substance use.  Previous medication trials: Multiple medication trials.   Review of Systems:  Review of Systems  Musculoskeletal:  Negative for gait problem.  Neurological:  Negative for tremors.  Psychiatric/Behavioral:         Please refer to HPI    Medications: I have reviewed the patient's current medications.  Current Outpatient Medications  Medication Sig Dispense Refill   buPROPion (WELLBUTRIN XL) 150 MG 24 hr tablet TAKE 3 TABLETS BY MOUTH EVERY MORNING 90 tablet 5   clonazePAM (KLONOPIN) 1 MG tablet Take 1 tablet (1 mg total) by mouth 3 (three) times daily as needed for anxiety. 90 tablet 2   folic acid (FOLVITE) 1 MG tablet Take 1 tablet (1 mg total) by mouth daily. 30 tablet 1   furosemide (LASIX) 20 MG tablet Take 1 tablet (20 mg total) by mouth daily. 30 tablet 1   metoCLOPramide (REGLAN) 10 MG tablet Take 1 tablet (10 mg total) by mouth every 6 (six) hours as needed for nausea. (Patient not taking: Reported on 02/09/2023) 20 tablet 0   pantoprazole (PROTONIX) 40 MG tablet Take 1 tablet (40 mg total) by mouth daily. 60 tablet 0   potassium chloride (KLOR-CON) 10 MEQ tablet Take 1 tablet (10 mEq total) by mouth daily. 30 tablet 0  spironolactone (ALDACTONE) 25 MG tablet Take 1 tablet (25 mg total) by mouth daily. 30 tablet 1   thiamine (VITAMIN B-1) 100 MG tablet Take 1 tablet (100 mg total) by mouth daily. 30 tablet 1   traZODone (DESYREL) 100 MG tablet Take 1 tablet (100 mg total) by mouth at bedtime. 30 tablet 5   No current facility-administered medications for this visit.    Medication Side Effects:  None  Allergies: No Known Allergies  Past Medical History:  Diagnosis Date   Anxiety    Depression    Mental disorder     No family history on file.  Social History   Socioeconomic History   Marital status: Single    Spouse name: Not on file   Number of children: Not on file   Years of education: Not on file   Highest education level: Not on file  Occupational History   Not on file  Tobacco Use   Smoking status: Every Day    Packs/day: 1.50    Years: 15.00    Additional pack years: 0.00    Total pack years: 22.50    Types: Cigarettes   Smokeless tobacco: Never  Substance and Sexual Activity   Alcohol use: Yes    Alcohol/week: 0.0 standard drinks of alcohol    Comment: none since discharge from [facility] 08/2012   Drug use: No   Sexual activity: Yes    Birth control/protection: Condom    Comment: GF on birth control  Other Topics Concern   Not on file  Social History Narrative   Not on file   Social Determinants of Health   Financial Resource Strain: Not on file  Food Insecurity: No Food Insecurity (02/10/2023)   Hunger Vital Sign    Worried About Running Out of Food in the Last Year: Never true    Ran Out of Food in the Last Year: Never true  Transportation Needs: No Transportation Needs (02/10/2023)   PRAPARE - Administrator, Civil Service (Medical): No    Lack of Transportation (Non-Medical): No  Physical Activity: Not on file  Stress: Not on file  Social Connections: Not on file  Intimate Partner Violence: Not At Risk (02/10/2023)   Humiliation, Afraid, Rape, and Kick questionnaire    Fear of Current or Ex-Partner: No    Emotionally Abused: No    Physically Abused: No    Sexually Abused: No    Past Medical History, Surgical history, Social history, and Family history were reviewed and updated as appropriate.   Please see review of systems for further details on the patient's review from today.   Objective:   Physical Exam:  There were no  vitals taken for this visit.  Physical Exam Constitutional:      General: He is not in acute distress. Musculoskeletal:        General: No deformity.  Neurological:     Mental Status: He is alert and oriented to person, place, and time.     Coordination: Coordination normal.  Psychiatric:        Attention and Perception: Attention and perception normal. He does not perceive auditory or visual hallucinations.        Mood and Affect: Mood normal. Mood is not anxious or depressed. Affect is not labile, blunt, angry or inappropriate.        Speech: Speech normal.        Behavior: Behavior normal.        Thought Content: Thought content  normal. Thought content is not paranoid or delusional. Thought content does not include homicidal or suicidal ideation. Thought content does not include homicidal or suicidal plan.        Cognition and Memory: Cognition and memory normal.        Judgment: Judgment normal.     Comments: Insight intact     Lab Review:     Component Value Date/Time   NA 135 02/10/2023 0532   K 3.6 02/10/2023 0532   CL 95 (L) 02/10/2023 0532   CO2 30 02/10/2023 0532   GLUCOSE 102 (H) 02/10/2023 0532   BUN 9 02/10/2023 0532   CREATININE 0.64 02/10/2023 0532   CALCIUM 8.6 (L) 02/10/2023 0532   PROT 6.9 02/10/2023 0532   ALBUMIN 3.4 (L) 02/10/2023 0532   AST 94 (H) 02/10/2023 0532   ALT 41 02/10/2023 0532   ALKPHOS 98 02/10/2023 0532   BILITOT 1.6 (H) 02/10/2023 0532   GFRNONAA >60 02/10/2023 0532   GFRAA >90 12/28/2013 1834       Component Value Date/Time   WBC 6.9 02/10/2023 0532   RBC 3.71 (L) 02/10/2023 0532   HGB 12.7 (L) 02/10/2023 0532   HCT 38.4 (L) 02/10/2023 0532   PLT 137 (L) 02/10/2023 0532   MCV 103.5 (H) 02/10/2023 0532   MCH 34.2 (H) 02/10/2023 0532   MCHC 33.1 02/10/2023 0532   RDW 12.7 02/10/2023 0532   LYMPHSABS 1.5 02/09/2023 1401   MONOABS 0.8 02/09/2023 1401   EOSABS 0.1 02/09/2023 1401   BASOSABS 0.1 02/09/2023 1401    No results  found for: "POCLITH", "LITHIUM"   No results found for: "PHENYTOIN", "PHENOBARB", "VALPROATE", "CBMZ"   .res Assessment: Plan:    Plan:  PDMP reviewed  Wellbutrin XL 450mg  daily. Denies seizure history. Clonazepam 1mg  TID for increased anxiety and panic - taking 2 to 3 daily Trazadone 100mg  daily   Denies alcohol use   RTC 3 months  Patient advised to contact office with any questions, adverse effects, or acute worsening in signs and symptoms.  Discussed potential benefits, risk, and side effects of benzodiazepines to include potential risk of tolerance and dependence, as well as possible drowsiness. Advised patient not to drive if experiencing drowsiness and to take lowest possible effective dose to minimize risk of dependence and tolerance.  Diagnoses and all orders for this visit:  Major depressive disorder, recurrent episode, moderate (HCC) -     buPROPion (WELLBUTRIN XL) 150 MG 24 hr tablet; TAKE 3 TABLETS BY MOUTH EVERY MORNING  Generalized anxiety disorder -     buPROPion (WELLBUTRIN XL) 150 MG 24 hr tablet; TAKE 3 TABLETS BY MOUTH EVERY MORNING -     clonazePAM (KLONOPIN) 1 MG tablet; Take 1 tablet (1 mg total) by mouth 3 (three) times daily as needed for anxiety.  Insomnia, unspecified type -     traZODone (DESYREL) 100 MG tablet; Take 1 tablet (100 mg total) by mouth at bedtime.  PTSD (post-traumatic stress disorder)  Panic attacks -     buPROPion (WELLBUTRIN XL) 150 MG 24 hr tablet; TAKE 3 TABLETS BY MOUTH EVERY MORNING -     clonazePAM (KLONOPIN) 1 MG tablet; Take 1 tablet (1 mg total) by mouth 3 (three) times daily as needed for anxiety.     Please see After Visit Summary for patient specific instructions.  No future appointments.   No orders of the defined types were placed in this encounter.     -------------------------------

## 2023-04-13 ENCOUNTER — Telehealth: Payer: Self-pay | Admitting: Adult Health

## 2023-04-13 NOTE — Telephone Encounter (Signed)
Claron called and was last seen on 12/10/22. He recently had an argument with someone who took charges against him. He states everything was dismissed but he is requesting a letter stating his mental health evaluation. His phone number is [phone].

## 2023-04-13 NOTE — Telephone Encounter (Signed)
LVM to RC 

## 2023-04-15 NOTE — Telephone Encounter (Signed)
Pt is scheduled in july

## 2023-04-15 NOTE — Telephone Encounter (Signed)
Please call to schedule an appt with Almira Coaster. Per Almira Coaster needs to be an hour appt. Not urgent.

## 2023-05-05 ENCOUNTER — Ambulatory Visit (INDEPENDENT_AMBULATORY_CARE_PROVIDER_SITE_OTHER): Payer: Commercial Managed Care - PPO | Admitting: Adult Health

## 2023-05-05 DIAGNOSIS — Z0389 Encounter for observation for other suspected diseases and conditions ruled out: Secondary | ICD-10-CM

## 2023-05-05 NOTE — Progress Notes (Signed)
Patient no show appointment. ? ?

## 2023-09-30 ENCOUNTER — Encounter: Payer: Self-pay | Admitting: Adult Health

## 2023-09-30 ENCOUNTER — Ambulatory Visit (INDEPENDENT_AMBULATORY_CARE_PROVIDER_SITE_OTHER): Payer: Self-pay | Admitting: Adult Health

## 2023-09-30 DIAGNOSIS — F411 Generalized anxiety disorder: Secondary | ICD-10-CM

## 2023-09-30 DIAGNOSIS — F331 Major depressive disorder, recurrent, moderate: Secondary | ICD-10-CM

## 2023-09-30 MED ORDER — CLONAZEPAM 1 MG PO TABS
1.0000 mg | ORAL_TABLET | Freq: Three times a day (TID) | ORAL | 1 refills | Status: DC | PRN
Start: 2023-09-30 — End: 2023-10-18

## 2023-09-30 MED ORDER — TRAZODONE HCL 100 MG PO TABS
100.0000 mg | ORAL_TABLET | Freq: Every day | ORAL | 5 refills | Status: DC
Start: 2023-09-30 — End: 2024-04-04

## 2023-09-30 MED ORDER — BUPROPION HCL ER (XL) 150 MG PO TB24
ORAL_TABLET | ORAL | 5 refills | Status: DC
Start: 2023-09-30 — End: 2024-04-04

## 2023-09-30 NOTE — Progress Notes (Signed)
Alfred Hicks [id number] [birth year] 42 y.o.  Subjective:   Patient ID:  Alfred Hicks is a 42 y.o. (DOB [birth year]) male.  Chief Complaint: No chief complaint on file.   HPI Alfred Hicks presents to the office today for follow-up of MDD, GAD, PTSD, panic attacks, and insomnia.  Describes mood today as "better". Pleasant. Reports tearfulness. Mood symptoms - reports depression and anxiety. Denies irritability. Reports daily panic attacks. Reports worry, rumination, and over thinking. Reports financial stressors with losing his job in May. Has been working odd jobs. Does not feel like he can maintain a job with current mental health issues. Stating "I don't feel like I'm doing too good". Has been out of medications for several days. Feels like medications are helpful when he is able to take them. Reports decreased interest and motivation. Taking medications as prescribed. Energy levels varies - "up and down". Active, does not have a regular exercise routine.   Enjoys some usual interests and activities. Single. Lives alone with yorkie (age 81). Has 1 son - teenager. Brother local.  Appetite adequate. Weight loss.  Sleeping better some Averages - 5 to 6 hours of broken sleep - up and down a lot. Focus and concentration stable. Completing tasks. Managing aspects of household. Works full time. Denies SI or HI.  Denies AH or VH. Denies self harm. Denies substance use. Reports minimal alcohol use - liver issues.  Previous medication trials: Multiple medication trials.  Review of Systems:  Review of Systems  Musculoskeletal:  Negative for gait problem.  Neurological:  Negative for tremors.  Psychiatric/Behavioral:         Please refer to HPI    Medications: I have reviewed the patient's current medications.  Current Outpatient Medications  Medication Sig Dispense Refill   buPROPion (WELLBUTRIN XL) 150 MG 24 hr tablet TAKE 3 TABLETS BY MOUTH EVERY MORNING 90 tablet  5   clonazePAM (KLONOPIN) 1 MG tablet Take 1 tablet (1 mg total) by mouth 3 (three) times daily as needed for anxiety. 90 tablet 2   folic acid (FOLVITE) 1 MG tablet Take 1 tablet (1 mg total) by mouth daily. 30 tablet 1   furosemide (LASIX) 20 MG tablet Take 1 tablet (20 mg total) by mouth daily. 30 tablet 1   metoCLOPramide (REGLAN) 10 MG tablet Take 1 tablet (10 mg total) by mouth every 6 (six) hours as needed for nausea. (Patient not taking: Reported on 02/09/2023) 20 tablet 0   pantoprazole (PROTONIX) 40 MG tablet Take 1 tablet (40 mg total) by mouth daily. 60 tablet 0   potassium chloride (KLOR-CON) 10 MEQ tablet Take 1 tablet (10 mEq total) by mouth daily. 30 tablet 0   spironolactone (ALDACTONE) 25 MG tablet Take 1 tablet (25 mg total) by mouth daily. 30 tablet 1   thiamine (VITAMIN B-1) 100 MG tablet Take 1 tablet (100 mg total) by mouth daily. 30 tablet 1   traZODone (DESYREL) 100 MG tablet Take 1 tablet (100 mg total) by mouth at bedtime. 30 tablet 5   No current facility-administered medications for this visit.    Medication Side Effects: None  Allergies: No Known Allergies  Past Medical History:  Diagnosis Date   Anxiety    Depression    Mental disorder     Past Medical History, Surgical history, Social history, and Family history were reviewed and updated as appropriate.   Please see review of systems for further details on the patient's review from today.   Objective:  Physical Exam:  There were no vitals taken for this visit.  Physical Exam Constitutional:      General: He is not in acute distress. Musculoskeletal:        General: No deformity.  Neurological:     Mental Status: He is alert and oriented to person, place, and time.     Coordination: Coordination normal.  Psychiatric:        Attention and Perception: Attention and perception normal. He does not perceive auditory or visual hallucinations.        Mood and Affect: Affect is not labile, blunt,  angry or inappropriate.        Speech: Speech normal.        Behavior: Behavior normal.        Thought Content: Thought content normal. Thought content is not paranoid or delusional. Thought content does not include homicidal or suicidal ideation. Thought content does not include homicidal or suicidal plan.        Cognition and Memory: Cognition and memory normal.        Judgment: Judgment normal.     Comments: Insight intact     Lab Review:     Component Value Date/Time   NA 135 02/10/2023 0532   K 3.6 02/10/2023 0532   CL 95 (L) 02/10/2023 0532   CO2 30 02/10/2023 0532   GLUCOSE 102 (H) 02/10/2023 0532   BUN 9 02/10/2023 0532   CREATININE 0.64 02/10/2023 0532   CALCIUM 8.6 (L) 02/10/2023 0532   PROT 6.9 02/10/2023 0532   ALBUMIN 3.4 (L) 02/10/2023 0532   AST 94 (H) 02/10/2023 0532   ALT 41 02/10/2023 0532   ALKPHOS 98 02/10/2023 0532   BILITOT 1.6 (H) 02/10/2023 0532   GFRNONAA >60 02/10/2023 0532   GFRAA >90 12/28/2013 1834       Component Value Date/Time   WBC 6.9 02/10/2023 0532   RBC 3.71 (L) 02/10/2023 0532   HGB 12.7 (L) 02/10/2023 0532   HCT 38.4 (L) 02/10/2023 0532   PLT 137 (L) 02/10/2023 0532   MCV 103.5 (H) 02/10/2023 0532   MCH 34.2 (H) 02/10/2023 0532   MCHC 33.1 02/10/2023 0532   RDW 12.7 02/10/2023 0532   LYMPHSABS 1.5 02/09/2023 1401   MONOABS 0.8 02/09/2023 1401   EOSABS 0.1 02/09/2023 1401   BASOSABS 0.1 02/09/2023 1401    No results found for: "POCLITH", "LITHIUM"   No results found for: "PHENYTOIN", "PHENOBARB", "VALPROATE", "CBMZ"   .res Assessment: Plan:    Plan:  PDMP reviewed  Wellbutrin XL 450mg  daily. Denies seizure history. Clonazepam 1mg  TID for increased anxiety and panic - taking 2 to 3 daily Trazadone 100mg  daily    Will call in 2 weeks with an update on mood symptoms.  RTC 3 months  Patient advised to contact office with any questions, adverse effects, or acute worsening in signs and symptoms.  Discussed potential  benefits, risk, and side effects of benzodiazepines to include potential risk of tolerance and dependence, as well as possible drowsiness. Advised patient not to drive if experiencing drowsiness and to take lowest possible effective dose to minimize risk of dependence and tolerance.  There are no diagnoses linked to this encounter.   Please see After Visit Summary for patient specific instructions.  Future Appointments  Date Time Provider Department Center  09/30/2023  1:20 PM Shaquon Gropp, Thereasa Solo, NP [facility] None    No orders of the defined types were placed in this encounter.   -------------------------------

## 2023-10-15 ENCOUNTER — Telehealth: Payer: Self-pay | Admitting: Adult Health

## 2023-10-15 NOTE — Telephone Encounter (Signed)
Patient called office and requested a call back. Attempted return call to patient and received VM - was able to leave a message to return call to office.

## 2023-10-18 ENCOUNTER — Other Ambulatory Visit: Payer: Self-pay | Admitting: Adult Health

## 2023-10-18 DIAGNOSIS — F411 Generalized anxiety disorder: Secondary | ICD-10-CM

## 2023-10-18 MED ORDER — CLONAZEPAM 1 MG PO TABS
1.0000 mg | ORAL_TABLET | Freq: Three times a day (TID) | ORAL | 1 refills | Status: DC | PRN
Start: 2023-10-18 — End: 2023-12-15

## 2023-10-18 NOTE — Progress Notes (Signed)
Medication refill

## 2023-12-15 ENCOUNTER — Other Ambulatory Visit: Payer: Self-pay

## 2023-12-15 DIAGNOSIS — F411 Generalized anxiety disorder: Secondary | ICD-10-CM

## 2023-12-15 MED ORDER — CLONAZEPAM 1 MG PO TABS
1.0000 mg | ORAL_TABLET | Freq: Three times a day (TID) | ORAL | 0 refills | Status: DC | PRN
Start: 2023-12-15 — End: 2023-12-31

## 2023-12-29 ENCOUNTER — Telehealth: Payer: Self-pay | Admitting: Adult Health

## 2023-12-31 ENCOUNTER — Telehealth (INDEPENDENT_AMBULATORY_CARE_PROVIDER_SITE_OTHER): Payer: Self-pay | Admitting: Adult Health

## 2023-12-31 ENCOUNTER — Encounter: Payer: Self-pay | Admitting: Adult Health

## 2023-12-31 DIAGNOSIS — G4700 Insomnia, unspecified: Secondary | ICD-10-CM | POA: Diagnosis not present

## 2023-12-31 DIAGNOSIS — F4310 Post-traumatic stress disorder, unspecified: Secondary | ICD-10-CM

## 2023-12-31 DIAGNOSIS — F331 Major depressive disorder, recurrent, moderate: Secondary | ICD-10-CM

## 2023-12-31 DIAGNOSIS — F411 Generalized anxiety disorder: Secondary | ICD-10-CM | POA: Diagnosis not present

## 2023-12-31 DIAGNOSIS — F410 Panic disorder [episodic paroxysmal anxiety] without agoraphobia: Secondary | ICD-10-CM

## 2023-12-31 MED ORDER — CLONAZEPAM 1 MG PO TABS
1.0000 mg | ORAL_TABLET | Freq: Three times a day (TID) | ORAL | 0 refills | Status: DC | PRN
Start: 2023-12-31 — End: 2024-04-04

## 2023-12-31 NOTE — Progress Notes (Signed)
 Alfred Hicks [id number] 12/11/1980 43 y.o.  Virtual Visit via Video Note  I connected with pt @ on 12/31/23 at  2:00 PM EST by a video enabled telemedicine application and verified that I am speaking with the correct person using two identifiers.   I discussed the limitations of evaluation and management by telemedicine and the availability of in person appointments. The patient expressed understanding and agreed to proceed.  I discussed the assessment and treatment plan with the patient. The patient was provided an opportunity to ask questions and all were answered. The patient agreed with the plan and demonstrated an understanding of the instructions.   The patient was advised to call back or seek an in-person evaluation if the symptoms worsen or if the condition fails to improve as anticipated.  I provided 25 minutes of non-face-to-face time during this encounter.  The patient was located at home.  The provider was located at [facility].   Dorothyann Gibbs, NP   Subjective:   Patient ID:  Alfred Hicks is a 43 y.o. (DOB 1981-06-29) male.  Chief Complaint: No chief complaint on file.   HPI Alfred Hicks presents for follow-up of MDD, GAD, PTSD, panic attacks, and insomnia.  Describes mood today as "better". Pleasant. Reports tearfulness. Mood symptoms - reports depression and anxiety - "a little higher lately". Reports varying interest and motivation. Denies irritability. Reports daily panic attacks - "lately". Reports worry, rumination, and over thinking. Reports financial stressors with losing his job. Reports working various jobs to stay ahead.  Does not feel like he can maintain a job with current mental health issues - "I can't be around a lot of people". Stating "I feel like things are sliding off a little bit". Feels like medications are helpful.Taking medications as prescribed. Energy levels varies - "up and down". Active, does not have a  regular exercise routine.   Enjoys some usual interests and activities. Single. Lives alone with yorkie (age 47). Has 1 son - 84 . Brother local.  Appetite adequate. Weight loss - 15 pounds.  Sleeping better some nights than others - "it fluctuates". Focus and concentration. Completing tasks. Managing aspects of household. Unemployed since 02/2023. Denies SI or HI.  Denies AH or VH. Denies self harm. Denies substance use. Denies recent alcohol use.   Previous medication trials: Multiple medication trials.  Review of Systems:  Review of Systems  Musculoskeletal:  Negative for gait problem.  Neurological:  Negative for tremors.  Psychiatric/Behavioral:         Please refer to HPI    Medications: I have reviewed the patient's current medications.  Current Outpatient Medications  Medication Sig Dispense Refill   buPROPion (WELLBUTRIN XL) 150 MG 24 hr tablet TAKE 3 TABLETS BY MOUTH EVERY MORNING 90 tablet 5   clonazePAM (KLONOPIN) 1 MG tablet Take 1 tablet (1 mg total) by mouth 3 (three) times daily as needed for anxiety. 90 tablet 0   folic acid (FOLVITE) 1 MG tablet Take 1 tablet (1 mg total) by mouth daily. 30 tablet 1   furosemide (LASIX) 20 MG tablet Take 1 tablet (20 mg total) by mouth daily. 30 tablet 1   metoCLOPramide (REGLAN) 10 MG tablet Take 1 tablet (10 mg total) by mouth every 6 (six) hours as needed for nausea. (Patient not taking: Reported on 02/09/2023) 20 tablet 0   pantoprazole (PROTONIX) 40 MG tablet Take 1 tablet (40 mg total) by mouth daily. 60 tablet 0   potassium chloride (KLOR-CON)  10 MEQ tablet Take 1 tablet (10 mEq total) by mouth daily. 30 tablet 0   spironolactone (ALDACTONE) 25 MG tablet Take 1 tablet (25 mg total) by mouth daily. 30 tablet 1   thiamine (VITAMIN B-1) 100 MG tablet Take 1 tablet (100 mg total) by mouth daily. 30 tablet 1   traZODone (DESYREL) 100 MG tablet Take 1 tablet (100 mg total) by mouth at bedtime. 30 tablet 5   No current  facility-administered medications for this visit.    Medication Side Effects: None  Allergies: No Known Allergies  Past Medical History:  Diagnosis Date   Anxiety    Depression    Mental disorder     No family history on file.  Social History   Socioeconomic History   Marital status: Single    Spouse name: Not on file   Number of children: Not on file   Years of education: Not on file   Highest education level: Not on file  Occupational History   Not on file  Tobacco Use   Smoking status: Every Day    Current packs/day: 1.50    Average packs/day: 1.5 packs/day for 15.0 years (22.5 ttl pk-yrs)    Types: Cigarettes   Smokeless tobacco: Never  Substance and Sexual Activity   Alcohol use: Yes    Alcohol/week: 0.0 standard drinks of alcohol    Comment: none since discharge from [facility] 08/2012   Drug use: No   Sexual activity: Yes    Birth control/protection: Condom    Comment: GF on birth control  Other Topics Concern   Not on file  Social History Narrative   Not on file   Social Drivers of Health   Financial Resource Strain: Not on file  Food Insecurity: No Food Insecurity (02/10/2023)   Hunger Vital Sign    Worried About Running Out of Food in the Last Year: Never true    Ran Out of Food in the Last Year: Never true  Transportation Needs: No Transportation Needs (02/10/2023)   PRAPARE - Administrator, Civil Service (Medical): No    Lack of Transportation (Non-Medical): No  Physical Activity: Not on file  Stress: Not on file  Social Connections: Not on file  Intimate Partner Violence: Not At Risk (02/10/2023)   Humiliation, Afraid, Rape, and Kick questionnaire    Fear of Current or Ex-Partner: No    Emotionally Abused: No    Physically Abused: No    Sexually Abused: No    Past Medical History, Surgical history, Social history, and Family history were reviewed and updated as appropriate.   Please see review of systems for further details on the  patient's review from today.   Objective:   Physical Exam:  There were no vitals taken for this visit.  Physical Exam Constitutional:      General: He is not in acute distress. Musculoskeletal:        General: No deformity.  Neurological:     Mental Status: He is alert and oriented to person, place, and time.     Coordination: Coordination normal.  Psychiatric:        Attention and Perception: Attention and perception normal. He does not perceive auditory or visual hallucinations.        Mood and Affect: Affect is not labile, blunt, angry or inappropriate.        Speech: Speech normal.        Behavior: Behavior normal.  Thought Content: Thought content normal. Thought content is not paranoid or delusional. Thought content does not include homicidal or suicidal ideation. Thought content does not include homicidal or suicidal plan.        Cognition and Memory: Cognition and memory normal.        Judgment: Judgment normal.     Comments: Insight intact     Lab Review:     Component Value Date/Time   NA 135 02/10/2023 0532   K 3.6 02/10/2023 0532   CL 95 (L) 02/10/2023 0532   CO2 30 02/10/2023 0532   GLUCOSE 102 (H) 02/10/2023 0532   BUN 9 02/10/2023 0532   CREATININE 0.64 02/10/2023 0532   CALCIUM 8.6 (L) 02/10/2023 0532   PROT 6.9 02/10/2023 0532   ALBUMIN 3.4 (L) 02/10/2023 0532   AST 94 (H) 02/10/2023 0532   ALT 41 02/10/2023 0532   ALKPHOS 98 02/10/2023 0532   BILITOT 1.6 (H) 02/10/2023 0532   GFRNONAA >60 02/10/2023 0532   GFRAA >90 12/28/2013 1834       Component Value Date/Time   WBC 6.9 02/10/2023 0532   RBC 3.71 (L) 02/10/2023 0532   HGB 12.7 (L) 02/10/2023 0532   HCT 38.4 (L) 02/10/2023 0532   PLT 137 (L) 02/10/2023 0532   MCV 103.5 (H) 02/10/2023 0532   MCH 34.2 (H) 02/10/2023 0532   MCHC 33.1 02/10/2023 0532   RDW 12.7 02/10/2023 0532   LYMPHSABS 1.5 02/09/2023 1401   MONOABS 0.8 02/09/2023 1401   EOSABS 0.1 02/09/2023 1401   BASOSABS 0.1  02/09/2023 1401    No results found for: "POCLITH", "LITHIUM"   No results found for: "PHENYTOIN", "PHENOBARB", "VALPROATE", "CBMZ"   .res Assessment: Plan:    Plan:  PDMP reviewed  Wellbutrin XL 450mg  daily. Denies seizure history. Clonazepam 1mg  TID for increased anxiety and panic - taking 2 to 3 daily. Trazadone 100mg  daily.    RTC 3 months  15 minutes spent dedicated to the care of this patient on the date of this encounter to include pre-visit review of records, ordering of medication, post visit documentation, and face-to-face time with the patient discussing MDD, GAD, PTSD, panic attacks, and insomnia. Discussed continuing current medication regimen.  Patient advised to contact office with any questions, adverse effects, or acute worsening in signs and symptoms.  Discussed potential benefits, risk, and side effects of benzodiazepines to include potential risk of tolerance and dependence, as well as possible drowsiness. Advised patient not to drive if experiencing drowsiness and to take lowest possible effective dose to minimize risk of dependence and tolerance.  There are no diagnoses linked to this encounter.   Please see After Visit Summary for patient specific instructions.  Future Appointments  Date Time Provider Department Center  12/31/2023  2:00 PM Eon Zunker, Thereasa Solo, NP [facility] None    No orders of the defined types were placed in this encounter.     -------------------------------

## 2024-03-31 ENCOUNTER — Telehealth: Payer: Self-pay | Admitting: Adult Health

## 2024-04-04 ENCOUNTER — Telehealth: Payer: Self-pay | Admitting: Adult Health

## 2024-04-04 ENCOUNTER — Encounter: Payer: Self-pay | Admitting: Adult Health

## 2024-04-04 DIAGNOSIS — F410 Panic disorder [episodic paroxysmal anxiety] without agoraphobia: Secondary | ICD-10-CM

## 2024-04-04 DIAGNOSIS — F4310 Post-traumatic stress disorder, unspecified: Secondary | ICD-10-CM

## 2024-04-04 DIAGNOSIS — G4700 Insomnia, unspecified: Secondary | ICD-10-CM

## 2024-04-04 DIAGNOSIS — F331 Major depressive disorder, recurrent, moderate: Secondary | ICD-10-CM

## 2024-04-04 DIAGNOSIS — F411 Generalized anxiety disorder: Secondary | ICD-10-CM

## 2024-04-04 MED ORDER — TRAZODONE HCL 100 MG PO TABS
100.0000 mg | ORAL_TABLET | Freq: Every day | ORAL | 5 refills | Status: AC
Start: 2024-04-04 — End: ?

## 2024-04-04 MED ORDER — BUPROPION HCL ER (XL) 150 MG PO TB24
ORAL_TABLET | ORAL | 5 refills | Status: AC
Start: 2024-04-04 — End: ?

## 2024-04-04 MED ORDER — CLONAZEPAM 1 MG PO TABS
1.0000 mg | ORAL_TABLET | Freq: Three times a day (TID) | ORAL | 2 refills | Status: AC | PRN
Start: 2024-04-04 — End: ?

## 2024-04-04 NOTE — Progress Notes (Signed)
 Alfred Hicks [id number] 09/08/1981 43 y.o.  Virtual Visit via Video Note  I connected with pt @ on 04/04/24 at  2:30 PM EDT by a video enabled telemedicine application and verified that I am speaking with the correct person using two identifiers.   I discussed the limitations of evaluation and management by telemedicine and the availability of in person appointments. The patient expressed understanding and agreed to proceed.  I discussed the assessment and treatment plan with the patient. The patient was provided an opportunity to ask questions and all were answered. The patient agreed with the plan and demonstrated an understanding of the instructions.   The patient was advised to call back or seek an in-person evaluation if the symptoms worsen or if the condition fails to improve as anticipated.  I provided 25 minutes of non-face-to-face time during this encounter.  The patient was located at home.  The provider was located at [facility].   Reagan Camera, NP   Subjective:   Patient ID:  Alfred Hicks is a 43 y.o. (DOB 07/16/1981) male.  Chief Complaint: No chief complaint on file.   HPI Alfred Hicks presents for follow-up of MDD, GAD, PTSD, panic attacks and insomnia.  Describes mood today as "ok". Pleasant. Reports tearfulness. Mood symptoms - reports depression - "it's up and down - like a wave". Reports varying interest and motivation. Reports anxiety - "a lot of the time". Reports social anxiety. Denies irritability. Reports daily panic attacks - "2 to 3 times a week in the mornings". Reports worry, rumination and over thinking. Reports financial stressors - lost his job a year ago. Reports working various jobs to make ends meet. Does not feel like he can maintain a job with current mental health issues - "difficulties driving - staying to himself". Stating "I feel like I'm doing better than I have been". Feels like medications are  helpful.Taking medications as prescribed. Energy levels increased. Active, does not have a regular exercise routine.   Enjoys some usual interests and activities. Single. Lives alone with yorkie (age 64). Has 1 son - 49 . Brother local.  Appetite adequate. Weight loss - 180 pounds - 69".  Sleep has improved. Averages 8 hours. Focus and concentration stable. Completing tasks. Managing aspects of household. 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  Denies SI or HI.  Denies AH or VH. Denies self harm. Denies substance use. Denies recent alcohol  use.   Previous medication trials: Multiple medication trials.  Review of Systems:  Review of Systems  Musculoskeletal:  Negative for gait problem.  Neurological:  Negative for tremors.  Psychiatric/Behavioral:         Please refer to HPI    Medications: I have reviewed the patient's current medications.  Current Outpatient Medications  Medication Sig Dispense Refill   buPROPion  (WELLBUTRIN  XL) 150 MG 24 hr tablet TAKE 3 TABLETS BY MOUTH EVERY MORNING 90 tablet 5   clonazePAM  (KLONOPIN ) 1 MG tablet Take 1 tablet (1 mg total) by mouth 3 (three) times daily as needed for anxiety. 90 tablet 0   folic acid  (FOLVITE ) 1 MG tablet Take 1 tablet (1 mg total) by mouth daily. 30 tablet 1   furosemide  (LASIX ) 20 MG tablet Take 1 tablet (20 mg total) by mouth daily. 30 tablet 1   metoCLOPramide  (REGLAN ) 10 MG tablet Take 1 tablet (10 mg total) by mouth every 6 (six) hours as needed for nausea. (Patient not taking: Reported on 02/09/2023) 20 tablet 0   pantoprazole  (  PROTONIX ) 40 MG tablet Take 1 tablet (40 mg total) by mouth daily. 60 tablet 0   potassium chloride  (KLOR-CON ) 10 MEQ tablet Take 1 tablet (10 mEq total) by mouth daily. 30 tablet 0   spironolactone  (ALDACTONE ) 25 MG tablet Take 1 tablet (25 mg total) by mouth daily. 30 tablet 1   thiamine  (VITAMIN B-1) 100 MG tablet Take 1 tablet (100 mg total) by mouth daily. 30 tablet 1   traZODone  (DESYREL ) 100 MG tablet  Take 1 tablet (100 mg total) by mouth at bedtime. 30 tablet 5   No current facility-administered medications for this visit.    Medication Side Effects: None  Allergies: No Known Allergies  Past Medical History:  Diagnosis Date   Anxiety    Depression    Mental disorder     No family history on file.  Social History   Socioeconomic History   Marital status: Single    Spouse name: Not on file   Number of children: Not on file   Years of education: Not on file   Highest education level: Not on file  Occupational History   Not on file  Tobacco Use   Smoking status: Every Day    Current packs/day: 1.50    Average packs/day: 1.5 packs/day for 15.0 years (22.5 ttl pk-yrs)    Types: Cigarettes   Smokeless tobacco: Never  Substance and Sexual Activity   Alcohol  use: Yes    Alcohol /week: 0.0 standard drinks of alcohol     Comment: none since discharge from [facility] 08/2012   Drug use: No   Sexual activity: Yes    Birth control/protection: Condom    Comment: GF on birth control  Other Topics Concern   Not on file  Social History Narrative   Not on file   Social Drivers of Health   Financial Resource Strain: Not on file  Food Insecurity: No Food Insecurity (02/10/2023)   Hunger Vital Sign    Worried About Running Out of Food in the Last Year: Never true    Ran Out of Food in the Last Year: Never true  Transportation Needs: No Transportation Needs (02/10/2023)   PRAPARE - Administrator, Civil Service (Medical): No    Lack of Transportation (Non-Medical): No  Physical Activity: Not on file  Stress: Not on file  Social Connections: Not on file  Intimate Partner Violence: Not At Risk (02/10/2023)   Humiliation, Afraid, Rape, and Kick questionnaire    Fear of Current or Ex-Partner: No    Emotionally Abused: No    Physically Abused: No    Sexually Abused: No    Past Medical History, Surgical history, Social history, and Family history were reviewed and updated  as appropriate.   Please see review of systems for further details on the patient's review from today.   Objective:   Physical Exam:  There were no vitals taken for this visit.  Physical Exam Constitutional:      General: He is not in acute distress. Musculoskeletal:        General: No deformity.  Neurological:     Mental Status: He is alert and oriented to person, place, and time.     Coordination: Coordination normal.  Psychiatric:        Attention and Perception: Attention and perception normal. He does not perceive auditory or visual hallucinations.        Mood and Affect: Mood normal. Mood is not anxious or depressed. Affect is not labile, blunt, angry  or inappropriate.        Speech: Speech normal.        Behavior: Behavior normal.        Thought Content: Thought content normal. Thought content is not paranoid or delusional. Thought content does not include homicidal or suicidal ideation. Thought content does not include homicidal or suicidal plan.        Cognition and Memory: Cognition and memory normal.        Judgment: Judgment normal.     Comments: Insight intact     Lab Review:     Component Value Date/Time   NA 135 02/10/2023 0532   K 3.6 02/10/2023 0532   CL 95 (L) 02/10/2023 0532   CO2 30 02/10/2023 0532   GLUCOSE 102 (H) 02/10/2023 0532   BUN 9 02/10/2023 0532   CREATININE 0.64 02/10/2023 0532   CALCIUM 8.6 (L) 02/10/2023 0532   PROT 6.9 02/10/2023 0532   ALBUMIN  3.4 (L) 02/10/2023 0532   AST 94 (H) 02/10/2023 0532   ALT 41 02/10/2023 0532   ALKPHOS 98 02/10/2023 0532   BILITOT 1.6 (H) 02/10/2023 0532   GFRNONAA >60 02/10/2023 0532   GFRAA >90 12/28/2013 1834       Component Value Date/Time   WBC 6.9 02/10/2023 0532   RBC 3.71 (L) 02/10/2023 0532   HGB 12.7 (L) 02/10/2023 0532   HCT 38.4 (L) 02/10/2023 0532   PLT 137 (L) 02/10/2023 0532   MCV 103.5 (H) 02/10/2023 0532   MCH 34.2 (H) 02/10/2023 0532   MCHC 33.1 02/10/2023 0532   RDW 12.7  02/10/2023 0532   LYMPHSABS 1.5 02/09/2023 1401   MONOABS 0.8 02/09/2023 1401   EOSABS 0.1 02/09/2023 1401   BASOSABS 0.1 02/09/2023 1401    No results found for: "POCLITH", "LITHIUM"   No results found for: "PHENYTOIN", "PHENOBARB", "VALPROATE", "CBMZ"   .res Assessment: Plan:    Plan:  PDMP reviewed  Wellbutrin  XL 450mg  daily. Denies seizure history. Clonazepam  1mg  TID for increased anxiety and panic - taking 2 to 3 daily. Trazadone 100mg  daily.    RTC 3 months  25 minutes spent dedicated to the care of this patient on the date of this encounter to include pre-visit review of records, ordering of medication, post visit documentation, and face-to-face time with the patient discussing MDD, GAD, PTSD, panic attacks and insomnia. Discussed continuing current medication regimen.  Patient advised to contact office with any questions, adverse effects, or acute worsening in signs and symptoms.  Discussed potential benefits, risk, and side effects of benzodiazepines to include potential risk of tolerance and dependence, as well as possible drowsiness. Advised patient not to drive if experiencing drowsiness and to take lowest possible effective dose to minimize risk of dependence and tolerance. There are no diagnoses linked to this encounter.   Please see After Visit Summary for patient specific instructions.  Future Appointments  Date Time Provider Department Center  04/04/2024  2:30 PM Constantine Ruddick Nattalie, NP [facility] None    No orders of the defined types were placed in this encounter.     -------------------------------
